# Patient Record
Sex: Female | Born: 1960 | Race: White | Hispanic: No | Marital: Married | Smoking: Never smoker
Health system: Southern US, Community
[De-identification: ages and names within clinical notes are randomized; demographics above are authoritative.]

## PROBLEM LIST (undated history)

## (undated) DIAGNOSIS — R51 Headache: Secondary | ICD-10-CM

## (undated) DIAGNOSIS — K219 Gastro-esophageal reflux disease without esophagitis: Secondary | ICD-10-CM

## (undated) DIAGNOSIS — R519 Headache, unspecified: Secondary | ICD-10-CM

## (undated) DIAGNOSIS — K8020 Calculus of gallbladder without cholecystitis without obstruction: Secondary | ICD-10-CM

## (undated) DIAGNOSIS — R112 Nausea with vomiting, unspecified: Secondary | ICD-10-CM

## (undated) DIAGNOSIS — Z9889 Other specified postprocedural states: Secondary | ICD-10-CM

## (undated) DIAGNOSIS — K819 Cholecystitis, unspecified: Secondary | ICD-10-CM

## (undated) DIAGNOSIS — Z87442 Personal history of urinary calculi: Secondary | ICD-10-CM

## (undated) HISTORY — PX: COLONOSCOPY: SHX174

## (undated) HISTORY — PX: TUBAL LIGATION: SHX77

## (undated) HISTORY — DX: Gastro-esophageal reflux disease without esophagitis: K21.9

## (undated) HISTORY — PX: OTHER SURGICAL HISTORY: SHX169

## (undated) HISTORY — PX: ABDOMINAL HYSTERECTOMY: SHX81

## (undated) HISTORY — PX: TONSILLECTOMY: SUR1361

---

## 1992-08-07 HISTORY — PX: TUBAL LIGATION: SHX77

## 1993-08-07 HISTORY — PX: VAGINAL HYSTERECTOMY: SUR661

## 1993-08-07 HISTORY — PX: ABDOMINAL HYSTERECTOMY: SHX81

## 1997-10-29 ENCOUNTER — Ambulatory Visit (HOSPITAL_COMMUNITY): Admission: RE | Admit: 1997-10-29 | Discharge: 1997-10-29 | Payer: Self-pay | Admitting: Obstetrics & Gynecology

## 1998-11-08 ENCOUNTER — Ambulatory Visit (HOSPITAL_COMMUNITY): Admission: RE | Admit: 1998-11-08 | Discharge: 1998-11-08 | Payer: Self-pay | Admitting: Obstetrics & Gynecology

## 1998-11-08 ENCOUNTER — Encounter: Payer: Self-pay | Admitting: Obstetrics & Gynecology

## 1998-12-01 ENCOUNTER — Other Ambulatory Visit: Admission: RE | Admit: 1998-12-01 | Discharge: 1998-12-01 | Payer: Self-pay | Admitting: Obstetrics & Gynecology

## 1999-12-12 ENCOUNTER — Ambulatory Visit (HOSPITAL_COMMUNITY): Admission: RE | Admit: 1999-12-12 | Discharge: 1999-12-12 | Payer: Self-pay | Admitting: Obstetrics & Gynecology

## 1999-12-12 ENCOUNTER — Encounter: Payer: Self-pay | Admitting: Obstetrics & Gynecology

## 1999-12-29 ENCOUNTER — Other Ambulatory Visit: Admission: RE | Admit: 1999-12-29 | Discharge: 1999-12-29 | Payer: Self-pay | Admitting: Obstetrics & Gynecology

## 2001-02-15 ENCOUNTER — Other Ambulatory Visit: Admission: RE | Admit: 2001-02-15 | Discharge: 2001-02-15 | Payer: Self-pay | Admitting: Obstetrics & Gynecology

## 2002-03-19 ENCOUNTER — Other Ambulatory Visit: Admission: RE | Admit: 2002-03-19 | Discharge: 2002-03-19 | Payer: Self-pay | Admitting: Obstetrics & Gynecology

## 2003-03-27 ENCOUNTER — Other Ambulatory Visit: Admission: RE | Admit: 2003-03-27 | Discharge: 2003-03-27 | Payer: Self-pay | Admitting: Obstetrics & Gynecology

## 2004-04-27 ENCOUNTER — Other Ambulatory Visit: Admission: RE | Admit: 2004-04-27 | Discharge: 2004-04-27 | Payer: Self-pay | Admitting: Obstetrics & Gynecology

## 2005-06-05 ENCOUNTER — Other Ambulatory Visit: Admission: RE | Admit: 2005-06-05 | Discharge: 2005-06-05 | Payer: Self-pay | Admitting: Obstetrics & Gynecology

## 2014-12-06 HISTORY — PX: BREAST BIOPSY: SHX20

## 2014-12-21 ENCOUNTER — Other Ambulatory Visit: Payer: Self-pay | Admitting: Obstetrics & Gynecology

## 2014-12-21 DIAGNOSIS — R928 Other abnormal and inconclusive findings on diagnostic imaging of breast: Secondary | ICD-10-CM

## 2014-12-25 ENCOUNTER — Other Ambulatory Visit: Payer: Self-pay | Admitting: Obstetrics & Gynecology

## 2014-12-25 ENCOUNTER — Ambulatory Visit
Admission: RE | Admit: 2014-12-25 | Discharge: 2014-12-25 | Disposition: A | Discharge status: Home / Self Care | Payer: BC Managed Care – PPO | Source: Ambulatory Visit | Attending: Obstetrics & Gynecology | Admitting: Obstetrics & Gynecology

## 2014-12-25 DIAGNOSIS — R928 Other abnormal and inconclusive findings on diagnostic imaging of breast: Secondary | ICD-10-CM

## 2014-12-29 ENCOUNTER — Ambulatory Visit
Admission: RE | Admit: 2014-12-29 | Discharge: 2014-12-29 | Disposition: A | Discharge status: Home / Self Care | Payer: BC Managed Care – PPO | Source: Ambulatory Visit | Attending: Obstetrics & Gynecology | Admitting: Obstetrics & Gynecology

## 2014-12-29 DIAGNOSIS — R928 Other abnormal and inconclusive findings on diagnostic imaging of breast: Secondary | ICD-10-CM

## 2014-12-31 ENCOUNTER — Other Ambulatory Visit: Payer: BC Managed Care – PPO

## 2016-02-16 ENCOUNTER — Other Ambulatory Visit: Payer: Self-pay | Admitting: Obstetrics & Gynecology

## 2016-02-16 DIAGNOSIS — Z1231 Encounter for screening mammogram for malignant neoplasm of breast: Secondary | ICD-10-CM

## 2016-02-24 ENCOUNTER — Ambulatory Visit
Admission: RE | Admit: 2016-02-24 | Discharge: 2016-02-24 | Disposition: A | Discharge status: Home / Self Care | Payer: BC Managed Care – PPO | Source: Ambulatory Visit | Attending: Obstetrics & Gynecology | Admitting: Obstetrics & Gynecology

## 2016-02-24 DIAGNOSIS — Z1231 Encounter for screening mammogram for malignant neoplasm of breast: Secondary | ICD-10-CM

## 2016-03-27 ENCOUNTER — Encounter: Payer: Self-pay | Admitting: Podiatry

## 2016-03-27 ENCOUNTER — Ambulatory Visit (INDEPENDENT_AMBULATORY_CARE_PROVIDER_SITE_OTHER): Payer: BC Managed Care – PPO | Admitting: Podiatry

## 2016-03-27 ENCOUNTER — Ambulatory Visit (INDEPENDENT_AMBULATORY_CARE_PROVIDER_SITE_OTHER): Payer: BC Managed Care – PPO

## 2016-03-27 VITALS — BP 134/84 | HR 91 | Resp 16 | Ht 62.0 in | Wt 190.0 lb

## 2016-03-27 DIAGNOSIS — M79672 Pain in left foot: Secondary | ICD-10-CM

## 2016-03-27 DIAGNOSIS — M778 Other enthesopathies, not elsewhere classified: Secondary | ICD-10-CM

## 2016-03-27 DIAGNOSIS — M7742 Metatarsalgia, left foot: Secondary | ICD-10-CM | POA: Diagnosis not present

## 2016-03-27 DIAGNOSIS — M79671 Pain in right foot: Secondary | ICD-10-CM | POA: Diagnosis not present

## 2016-03-27 DIAGNOSIS — M779 Enthesopathy, unspecified: Secondary | ICD-10-CM

## 2016-03-27 DIAGNOSIS — M7752 Other enthesopathy of left foot: Secondary | ICD-10-CM

## 2016-03-27 NOTE — Progress Notes (Signed)
Subjective:     Patient ID: Charlene Dunn, female   DOB: 05-Jul-1961, 55 y.o.   MRN: [id number]  HPI patient states that she's been getting pain in the forefoot of her left foot that's been localized in nature but she does not remember specific injury. States that it sometimes makes it hard for her to walk and she does not feel enough support   Review of Systems  All other systems reviewed and are negative.      Objective:   Physical Exam  Constitutional: She is oriented to person, place, and time.  Cardiovascular: Intact distal pulses.   Musculoskeletal: Normal range of motion.  Neurological: She is oriented to person, place, and time.  Skin: Skin is warm.  Nursing note and vitals reviewed.  neurovascular status intact muscle strength adequate range of motion within normal limits with patient found to have inflammation pain forefoot left with fluid buildup around the metatarsophalangeal joints of a mild to moderate nature     Assessment:     Inflammatory capsulitis left    Plan:     H&P x-rays reviewed and dispensed a fascial brace to lift the arch and take forefoot stress off. Advised this may require a softer type orthotic and she'll bring and what she has from the past and we will decide if there is anything new we can do. Reappoint to recheck  X-rays were negative for signs of fracture or indications of metatarsal parabola issues

## 2016-03-27 NOTE — Progress Notes (Signed)
   Subjective:    Patient ID: Charlene Dunn, female    DOB: April 07, 1961, 55 y.o.   MRN: [id number]  HPI Chief Complaint  Patient presents with  . Foot Pain    Bilateral; Left foot-dorsal & below great toe; pt stated, "Had a stress fracture in Left foot, dorsal in 2009, got numbness after that"      Review of Systems  All other systems reviewed and are negative.      Objective:   Physical Exam        Assessment & Plan:

## 2016-04-24 ENCOUNTER — Ambulatory Visit (INDEPENDENT_AMBULATORY_CARE_PROVIDER_SITE_OTHER): Payer: BC Managed Care – PPO | Admitting: Podiatry

## 2016-04-24 DIAGNOSIS — M5432 Sciatica, left side: Secondary | ICD-10-CM

## 2016-04-24 DIAGNOSIS — M5416 Radiculopathy, lumbar region: Secondary | ICD-10-CM | POA: Diagnosis not present

## 2016-04-24 MED ORDER — PRAMIPEXOLE DIHYDROCHLORIDE 0.125 MG PO TABS: 0.1250 mg | ORAL_TABLET | Freq: Three times a day (TID) | ORAL | 0 refills | Status: DC

## 2016-04-24 NOTE — Progress Notes (Signed)
Subjective: Patient presents today with pain and numbness on the dorsum of the left foot which extends up the leg. Patient states that she does experience pain extending proximally throughout the calf and thigh and proximal region of the left lower extremity. Pain is worse especially at night   Objective: Physical Exam General: The patient is alert and oriented x3 in no acute distress.  Dermatology: Skin is warm, dry and supple bilateral lower extremities. Negative for open lesions or macerations.  Vascular: Palpable pedal pulses bilaterally. No edema or erythema noted. Capillary refill within normal limits.  Neurological: Neuritis noted diffusely throughout the left lower extremity. Epicritic and protective threshold grossly intact bilaterally.   Musculoskeletal Exam: Range of motion within normal limits to all pedal and ankle joints bilateral. Muscle strength 5/5 in all groups bilateral.   Assessment: #1 lumbar radiculopathy #2 sciatica left lower extremity #3 paresthesia left foot dorsal #5 cramping left lower extremity. Restless leg syndrome left lower extremity Problem List Items Addressed This Visit    None    Visit Diagnoses   None.       Plan of Care:  #1 Patient was evaluated. #2 Prescription for Mirapex 0.125 mg was dispensed the patient. Take as directed. #3 referral to neuro spine specialist Dr. Danielle DessElsner was given to the patient. #4 patient is to return to clinic on a when necessary basis. At this point there is no further treatment modalities regarding a podiatric foot and ankle specialist. Symptomology and pathology extends proximal possibly to the level of the spine     Dr. Felecia ShellingBrent M. Chevy Sweigert, DPM [facility]

## 2016-04-26 ENCOUNTER — Telehealth: Payer: Self-pay | Admitting: *Deleted

## 2016-04-26 DIAGNOSIS — M5416 Radiculopathy, lumbar region: Secondary | ICD-10-CM

## 2016-04-26 DIAGNOSIS — M5432 Sciatica, left side: Secondary | ICD-10-CM

## 2016-04-26 NOTE — Telephone Encounter (Signed)
Dr. Logan BoresEvans ordered referral to Dr. Danielle DessElsner for lumbar radiculopathy, left extremity sciatica. Faxed referral, pt demographics and clinicals to [facility].

## 2016-05-02 ENCOUNTER — Telehealth: Payer: Self-pay | Admitting: *Deleted

## 2016-05-02 NOTE — Telephone Encounter (Signed)
Pt called for the status of her referral to Dr. Danielle DessElsner. I spoke with pt and she states the new pt coordinator for Dr. Danielle DessElsner just called her.

## 2016-08-30 ENCOUNTER — Other Ambulatory Visit: Payer: Self-pay | Admitting: Family Medicine

## 2016-08-30 DIAGNOSIS — R1011 Right upper quadrant pain: Secondary | ICD-10-CM

## 2016-08-31 ENCOUNTER — Ambulatory Visit
Admission: RE | Admit: 2016-08-31 | Discharge: 2016-08-31 | Disposition: A | Discharge status: Home / Self Care | Payer: BC Managed Care – PPO | Source: Ambulatory Visit | Attending: Family Medicine | Admitting: Family Medicine

## 2016-08-31 DIAGNOSIS — R1011 Right upper quadrant pain: Secondary | ICD-10-CM

## 2016-09-14 ENCOUNTER — Ambulatory Visit: Payer: Self-pay | Admitting: Surgery

## 2016-09-14 NOTE — H&P (Signed)
Charlene Dunn 09/14/2016 9:41 AM Location: [facility] Patient #: [id number] DOB: 1961-02-11 Married / Language: English / Race: White Female  History of Present Illness (Charlene Gwynne A. Fredricka Bonineonnor MD; 09/14/2016 9:59 AM) Patient words: very nice 56 year old woman who is referred for gallstones. She has been having intermittent attacks of right upper quadrant pain for about 10 years. Initially she had an attack once every 2 or 3 years. This past year starting in the summer, the attacks have increased in frequency to every few months, in addition to assist severity of the pain and the duration has worsened. The pain is epigastric and right upper quadrant radiating around to the right flank and back. The most recent couple of attacks were associated with severe nausea and she did have some emesis with the most recent one. She's not had any fevers and has not been treated with any antibiotics, but during her last attack she did take some tramadol that she had at home. This was in mid-late January. Since then she has been sticking to a low-fat diet due to fear of recurrent symptoms. She is undergone an ultrasound which demonstrated multiple gallstones, common bile duct diameter 7.2 mm. Otherwise normal liver parenchyma and no biliary distention. She had lab work done by her primary care doctor, her creatinine is 0.69, lites were normal, LFTs were normal, specifically bilirubin 0.4, alkaline phosphatase 87, AST and ALT 17.  She is here today with her husband, Charlene Dunn. 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 She has never been a smoker.  The patient is a 56 year old female.   Past Surgical History [name](Armen Glenn, CMA; 09/14/2016 9:41 AM) Breast Biopsy Right. Hysterectomy (not due to cancer) - Partial Tonsillectomy  Diagnostic Studies History [name](Armen Glenn, CMA; 09/14/2016 9:41 AM) Colonoscopy 1-5 years ago Mammogram within last year Pap Smear 1-5 years ago  Allergies [name](Armen Glenn, CMA;  09/14/2016 9:48 AM) CODEINE Nausea, Vomiting. Sulfa Antibiotics Nausea, Vomiting.  Medication History [name](Armen Glenn, CMA; 09/14/2016 9:54 AM) Omeprazole (40MG  Capsule DR, Oral) Active. Calcium Carbonate (600MG  Tablet, Oral) Active. Estradiol (2MG  Tablet, Oral) Active. Multivitamins/Minerals (Oral) Active. Medications Reconciled  Social History [name](Armen Glenn, CMA; 09/14/2016 9:41 AM) Alcohol use Occasional alcohol use. Caffeine use Carbonated beverages, Tea. No drug use Tobacco use Never smoker.  Family History [name](Armen Glenn, CMA; 09/14/2016 9:41 AM) Arthritis Mother. Colon Cancer Father. Colon Polyps Father. Heart Disease Father. Hypertension Brother. Melanoma Mother.  Pregnancy / Birth History [name](Armen Glenn, CMA; 09/14/2016 9:41 AM) Age at menarche 14 years. Age of menopause [age] Contraceptive History Oral contraceptives. Gravida 2 Maternal age 54-30 Para 2  Other Problems [name](Armen Glenn, CMA; 09/14/2016 9:41 AM) Cholelithiasis Gastroesophageal Reflux Disease Kidney Stone     Review of Systems [name](Armen Glenn CMA; 09/14/2016 9:41 AM) General Present- Fatigue. Not Present- Appetite Loss, Chills, Fever, Night Sweats, Weight Gain and Weight Loss. Skin Not Present- Change in Wart/Mole, Dryness, Hives, Jaundice, New Lesions, Non-Healing Wounds, Rash and Ulcer. HEENT Present- Seasonal Allergies and Wears glasses/contact lenses. Not Present- Earache, Hearing Loss, Hoarseness, Nose Bleed, Oral Ulcers, Ringing in the Ears, Sinus Pain, Sore Throat, Visual Disturbances and Yellow Eyes. Respiratory Present- Snoring. Not Present- Bloody sputum, Chronic Cough, Difficulty Breathing and Wheezing. Breast Not Present- Breast Mass, Breast Pain, Nipple Discharge and Skin Changes. Cardiovascular Not Present- Chest Pain, Difficulty Breathing Lying Down, Leg Cramps, Palpitations, Rapid Heart Rate, Shortness of Breath and Swelling of Extremities. Gastrointestinal Present- Abdominal Pain  and Bloating. Not Present- Bloody Stool, Change in Bowel  Habits, Chronic diarrhea, Constipation, Difficulty Swallowing, Excessive gas, Gets full quickly at meals, Hemorrhoids, Indigestion, Nausea, Rectal Pain and Vomiting. Female Genitourinary Present- Nocturia and Urgency. Not Present- Frequency, Painful Urination and Pelvic Pain. Musculoskeletal Present- Joint Pain and Joint Stiffness. Not Present- Back Pain, Muscle Pain, Muscle Weakness and Swelling of Extremities. Neurological Present- Headaches. Not Present- Decreased Memory, Fainting, Numbness, Seizures, Tingling, Tremor, Trouble walking and Weakness. Psychiatric Not Present- Anxiety, Bipolar, Change in Sleep Pattern, Depression, Fearful and Frequent crying. Endocrine Present- Hot flashes. Not Present- Cold Intolerance, Excessive Hunger, Hair Changes, Heat Intolerance and New Diabetes. Hematology Not Present- Blood Thinners, Easy Bruising, Excessive bleeding, Gland problems, HIV and Persistent Infections.  Vitals (Armen Glenn CMA; 09/14/2016 9:42 AM) 09/14/2016 9:42 AM Weight: 203.5 lb Height: 61in Body Surface Area: 1.9 m Body Mass Index: 38.45 kg/m  Temp.: 98.67F  Pulse: 108 (Regular)  P.OX: 98% (Room air) BP: 150/90 (Sitting, Left Arm, Standard)      Physical Exam (Avyn Coate A. Fredricka Bonine MD; 09/14/2016 10:01 AM)  General Note: Alert and oriented, no distress  Integumentary Note: No lesions or rashes on limited skin exam  Head and Neck Note: No mass or thyromegaly  Eye Note: Anicteric, extraocular motion intact  ENMT Note: moist mucus membranes, good dentition  Chest and Lung Exam Note: unlabored respirations, symmetrical air entry  Cardiovascular Note: regular rate and rhythm, palpable distal pulses  Abdomen Note: obese, minimally tender in RUQ subcostal margin. No hernia or mass.  Neurologic Note: grossly normal, normal gait  Neuropsychiatric Note: normal mood and affect, appropriate  insight  Musculoskeletal Note: strength symmetrical throughout, no deformity    Assessment & Plan (Esaias Cleavenger A. Preslynn Bier MD; 09/14/2016 10:03 AM)  SYMPTOMATIC CHOLELITHIASIS (K80.20) Story: She has classic symptoms, and increasing frequency and severity of attacks. May additionally have some component of chronic cholecystitis. I discussed with her laparoscopic cholecystectomy, we discussed the risks of conversion to open surgery, bleeding, infection, pain, scarring, bile leak, injury to adjacent structures specifically the common bile duct and sequelae. She expressed understanding and asked appropriate questions. We will get surgery scheduled as soon as possible given her crescendoing symptoms.

## 2016-09-18 NOTE — Patient Instructions (Addendum)
Charlene Dunn  09/18/2016   Your procedure is scheduled on:  09-25-16  Report [facility]  [facility] to 3rd floor to  Short Stay Center at 730AM.  Call this number if you have problems the morning of surgery [phone]   Remember: ONLY 1 PERSON MAY GO WITH YOU TO SHORT STAY TO GET  READY MORNING OF YOUR SURGERY.  Do not eat food or drink liquids :After Midnight.     Take these medicines the morning of surgery with A SIP OF WATER: tylenol as needed, omeprazole(prilosec)                                You may not have any metal on your body including hair pins and              piercings  Do not wear jewelry, make-up, lotions, powders or perfumes, deodorant             Do not wear nail polish.  Do not shave  48 hours prior to surgery.              Men may shave face and neck.   Do not bring valuables to the hospital. [facility] IS NOT             RESPONSIBLE   FOR VALUABLES.  Contacts, dentures or bridgework may not be worn into surgery.  Leave suitcase in the car. After surgery it may be brought to your room.     Patients discharged the day of surgery will not be allowed to drive home.  Name and phone number of your driver:  Special Instructions: N/A              Please read over the following fact sheets you were given: _____________________________________________________________________             [facility] - Preparing for [facility] Before surgery, you can play an important role.  Because skin is not sterile, your skin needs to be as free of germs as possible.  You can reduce the number of germs on your skin by washing with CHG (chlorahexidine gluconate) soap before surgery.  CHG is an antiseptic cleaner which kills germs and bonds with the skin to continue killing germs even after washing. Please DO NOT use if you have an allergy to CHG or antibacterial soaps.  If your skin becomes reddened/irritated stop using the CHG and  inform your nurse when you arrive at Short Stay. Do not shave (including legs and underarms) for at least 48 hours prior to the first CHG shower.  You may shave your face/neck. Please follow these instructions carefully:  1.  Shower with CHG Soap the night before surgery and the  morning of Surgery.  2.  If you choose to wash your hair, wash your hair first as usual with your  normal  shampoo.  3.  After you shampoo, rinse your hair and body thoroughly to remove the  shampoo.                           4.  Use CHG as you would any other liquid soap.  You can apply chg directly  to the skin and wash  Gently with a scrungie or clean washcloth.  5.  Apply the CHG Soap to your body ONLY FROM THE NECK DOWN.   Do not use on face/ open                           Wound or open sores. Avoid contact with eyes, ears mouth and genitals (private parts).                       Wash face,  Genitals (private parts) with your normal soap.             6.  Wash thoroughly, paying special attention to the area where your surgery  will be performed.  7.  Thoroughly rinse your body with warm water from the neck down.  8.  DO NOT shower/wash with your normal soap after using and rinsing off  the CHG Soap.                9.  Pat yourself dry with a clean towel.            10.  Wear clean pajamas.            11.  Place clean sheets on your bed the night of your first shower and do not  sleep with pets. Day of Surgery : Do not apply any lotions/deodorants the morning of surgery.  Please wear clean clothes to the hospital/surgery center.  FAILURE TO FOLLOW THESE INSTRUCTIONS MAY RESULT IN THE CANCELLATION OF YOUR SURGERY PATIENT SIGNATURE_________________________________  NURSE SIGNATURE__________________________________  ________________________________________________________________________

## 2016-09-19 ENCOUNTER — Encounter (HOSPITAL_COMMUNITY): Payer: Self-pay | Admitting: Emergency Medicine

## 2016-09-19 ENCOUNTER — Encounter (HOSPITAL_COMMUNITY)
Admission: RE | Admit: 2016-09-19 | Discharge: 2016-09-19 | Disposition: A | Discharge status: Home / Self Care | Payer: BC Managed Care – PPO | Source: Ambulatory Visit | Attending: Surgery | Admitting: Surgery

## 2016-09-19 DIAGNOSIS — Z01812 Encounter for preprocedural laboratory examination: Secondary | ICD-10-CM | POA: Insufficient documentation

## 2016-09-19 DIAGNOSIS — R9431 Abnormal electrocardiogram [ECG] [EKG]: Secondary | ICD-10-CM | POA: Diagnosis not present

## 2016-09-19 DIAGNOSIS — Z01810 Encounter for preprocedural cardiovascular examination: Secondary | ICD-10-CM | POA: Insufficient documentation

## 2016-09-19 HISTORY — DX: Nausea with vomiting, unspecified: R11.2

## 2016-09-19 HISTORY — DX: Other specified postprocedural states: Z98.890

## 2016-09-19 HISTORY — DX: Headache: R51

## 2016-09-19 HISTORY — DX: Headache, unspecified: R51.9

## 2016-09-19 HISTORY — DX: Calculus of gallbladder without cholecystitis without obstruction: K80.20

## 2016-09-19 HISTORY — DX: Cholecystitis, unspecified: K81.9

## 2016-09-19 LAB — CBC
HCT: 38.4 % (ref 36.0–46.0)
HEMOGLOBIN: 12.7 g/dL (ref 12.0–15.0)
MCH: 29.3 pg (ref 26.0–34.0)
MCHC: 33.1 g/dL (ref 30.0–36.0)
MCV: 88.5 fL (ref 78.0–100.0)
Platelets: 361 10*3/uL (ref 150–400)
RBC: 4.34 MIL/uL (ref 3.87–5.11)
RDW: 14 % (ref 11.5–15.5)
WBC: 7.6 10*3/uL (ref 4.0–10.5)

## 2016-09-25 ENCOUNTER — Encounter (HOSPITAL_COMMUNITY): Payer: Self-pay | Admitting: Anesthesiology

## 2016-09-25 ENCOUNTER — Ambulatory Visit (HOSPITAL_COMMUNITY)
Admission: RE | Admit: 2016-09-25 | Discharge: 2016-09-25 | Disposition: A | Discharge status: Home / Self Care | Payer: BC Managed Care – PPO | Source: Ambulatory Visit | Attending: Surgery | Admitting: Surgery

## 2016-09-25 ENCOUNTER — Ambulatory Visit (HOSPITAL_COMMUNITY): Payer: BC Managed Care – PPO | Admitting: Anesthesiology

## 2016-09-25 ENCOUNTER — Encounter (HOSPITAL_COMMUNITY)
Admission: RE | Disposition: A | Discharge status: Home / Self Care | Payer: Self-pay | Source: Ambulatory Visit | Attending: Surgery

## 2016-09-25 DIAGNOSIS — Z882 Allergy status to sulfonamides status: Secondary | ICD-10-CM | POA: Diagnosis not present

## 2016-09-25 DIAGNOSIS — K219 Gastro-esophageal reflux disease without esophagitis: Secondary | ICD-10-CM | POA: Diagnosis not present

## 2016-09-25 DIAGNOSIS — E669 Obesity, unspecified: Secondary | ICD-10-CM | POA: Insufficient documentation

## 2016-09-25 DIAGNOSIS — Z885 Allergy status to narcotic agent status: Secondary | ICD-10-CM | POA: Insufficient documentation

## 2016-09-25 DIAGNOSIS — Z6837 Body mass index (BMI) 37.0-37.9, adult: Secondary | ICD-10-CM | POA: Insufficient documentation

## 2016-09-25 DIAGNOSIS — K8010 Calculus of gallbladder with chronic cholecystitis without obstruction: Secondary | ICD-10-CM | POA: Diagnosis not present

## 2016-09-25 DIAGNOSIS — K8050 Calculus of bile duct without cholangitis or cholecystitis without obstruction: Secondary | ICD-10-CM | POA: Diagnosis present

## 2016-09-25 HISTORY — PX: CHOLECYSTECTOMY: SHX55

## 2016-09-25 SURGERY — LAPAROSCOPIC CHOLECYSTECTOMY
Anesthesia: General | Site: Abdomen

## 2016-09-25 MED ORDER — FENTANYL CITRATE (PF) 100 MCG/2ML IJ SOLN
25.0000 ug | INTRAMUSCULAR | Status: DC | PRN
  Administered 2016-09-25: 50 ug via INTRAVENOUS

## 2016-09-25 MED ORDER — SUFENTANIL CITRATE 50 MCG/ML IV SOLN
INTRAVENOUS | Status: DC | PRN
  Administered 2016-09-25 (×3): 5 ug via INTRAVENOUS

## 2016-09-25 MED ORDER — ONDANSETRON HCL 4 MG/2ML IJ SOLN
INTRAMUSCULAR | Status: AC
  Filled 2016-09-25: qty 2

## 2016-09-25 MED ORDER — SUFENTANIL CITRATE 50 MCG/ML IV SOLN
INTRAVENOUS | Status: AC
  Filled 2016-09-25: qty 1

## 2016-09-25 MED ORDER — LACTATED RINGERS IR SOLN
Status: DC | PRN
  Administered 2016-09-25: 1000 mL

## 2016-09-25 MED ORDER — ROCURONIUM BROMIDE 50 MG/5ML IV SOSY
PREFILLED_SYRINGE | INTRAVENOUS | Status: AC
  Filled 2016-09-25: qty 5

## 2016-09-25 MED ORDER — SODIUM CHLORIDE 0.9% FLUSH: 3.0000 mL | Freq: Two times a day (BID) | INTRAVENOUS | Status: DC

## 2016-09-25 MED ORDER — CELECOXIB 200 MG PO CAPS
ORAL_CAPSULE | ORAL | Status: AC
  Filled 2016-09-25: qty 1

## 2016-09-25 MED ORDER — HYDROMORPHONE HCL 1 MG/ML IJ SOLN: 0.2500 mg | INTRAMUSCULAR | Status: DC | PRN

## 2016-09-25 MED ORDER — PROPOFOL 10 MG/ML IV BOLUS
INTRAVENOUS | Status: DC | PRN
  Administered 2016-09-25: 200 mg via INTRAVENOUS

## 2016-09-25 MED ORDER — LIDOCAINE 2% (20 MG/ML) 5 ML SYRINGE
INTRAMUSCULAR | Status: AC
  Filled 2016-09-25: qty 5

## 2016-09-25 MED ORDER — LIDOCAINE HCL (CARDIAC) 20 MG/ML IV SOLN
INTRAVENOUS | Status: DC | PRN
  Administered 2016-09-25: 100 mg via INTRAVENOUS

## 2016-09-25 MED ORDER — GABAPENTIN 300 MG PO CAPS
300.0000 mg | ORAL_CAPSULE | ORAL | Status: AC
  Administered 2016-09-25: 300 mg via ORAL

## 2016-09-25 MED ORDER — SODIUM CHLORIDE 0.9 % IJ SOLN
INTRAMUSCULAR | Status: AC
  Filled 2016-09-25: qty 10

## 2016-09-25 MED ORDER — CELECOXIB 200 MG PO CAPS
400.0000 mg | ORAL_CAPSULE | ORAL | Status: AC
  Administered 2016-09-25: 400 mg via ORAL

## 2016-09-25 MED ORDER — BUPIVACAINE HCL (PF) 0.25 % IJ SOLN
INTRAMUSCULAR | Status: AC
  Filled 2016-09-25: qty 30

## 2016-09-25 MED ORDER — SODIUM CHLORIDE 0.9% FLUSH: 3.0000 mL | INTRAVENOUS | Status: DC | PRN

## 2016-09-25 MED ORDER — KETOROLAC TROMETHAMINE 30 MG/ML IJ SOLN
INTRAMUSCULAR | Status: AC
  Administered 2016-09-25: 30 mg via INTRAVENOUS
  Filled 2016-09-25: qty 1

## 2016-09-25 MED ORDER — BUPIVACAINE HCL 0.25 % IJ SOLN
INTRAMUSCULAR | Status: DC | PRN
  Administered 2016-09-25: 30 mL

## 2016-09-25 MED ORDER — DEXAMETHASONE SODIUM PHOSPHATE 10 MG/ML IJ SOLN
INTRAMUSCULAR | Status: AC
  Filled 2016-09-25: qty 1

## 2016-09-25 MED ORDER — DOCUSATE SODIUM 100 MG PO CAPS: 100.0000 mg | ORAL_CAPSULE | Freq: Two times a day (BID) | ORAL | 0 refills | Status: AC

## 2016-09-25 MED ORDER — MIDAZOLAM HCL 5 MG/5ML IJ SOLN
INTRAMUSCULAR | Status: DC | PRN
  Administered 2016-09-25: 2 mg via INTRAVENOUS

## 2016-09-25 MED ORDER — GABAPENTIN 300 MG PO CAPS
ORAL_CAPSULE | ORAL | Status: AC
  Filled 2016-09-25: qty 1

## 2016-09-25 MED ORDER — SCOPOLAMINE 1 MG/3DAYS TD PT72
1.0000 | MEDICATED_PATCH | Freq: Once | TRANSDERMAL | Status: DC
  Administered 2016-09-25: 1.5 mg via TRANSDERMAL

## 2016-09-25 MED ORDER — PROMETHAZINE HCL 25 MG/ML IJ SOLN
INTRAMUSCULAR | Status: AC
  Administered 2016-09-25: 6.25 mg via INTRAVENOUS
  Filled 2016-09-25: qty 1

## 2016-09-25 MED ORDER — MIDAZOLAM HCL 2 MG/2ML IJ SOLN
INTRAMUSCULAR | Status: AC
  Filled 2016-09-25: qty 2

## 2016-09-25 MED ORDER — MEPERIDINE HCL 50 MG/ML IJ SOLN
6.2500 mg | INTRAMUSCULAR | Status: DC | PRN
Start: 2016-09-25 — End: 2016-09-25

## 2016-09-25 MED ORDER — SUGAMMADEX SODIUM 200 MG/2ML IV SOLN
INTRAVENOUS | Status: DC | PRN
  Administered 2016-09-25: 200 mg via INTRAVENOUS

## 2016-09-25 MED ORDER — ACETAMINOPHEN 500 MG PO TABS
ORAL_TABLET | ORAL | Status: AC
  Filled 2016-09-25: qty 2

## 2016-09-25 MED ORDER — DEXAMETHASONE SODIUM PHOSPHATE 10 MG/ML IJ SOLN
INTRAMUSCULAR | Status: DC | PRN
  Administered 2016-09-25: 10 mg via INTRAVENOUS

## 2016-09-25 MED ORDER — OXYCODONE HCL 5 MG PO TABS: 5.0000 mg | ORAL_TABLET | ORAL | Status: DC | PRN

## 2016-09-25 MED ORDER — LACTATED RINGERS IV SOLN
INTRAVENOUS | Status: DC | PRN
  Administered 2016-09-25 (×2): via INTRAVENOUS

## 2016-09-25 MED ORDER — CEFAZOLIN SODIUM-DEXTROSE 2-3 GM-% IV SOLR
INTRAVENOUS | Status: DC | PRN
  Administered 2016-09-25: 2 g via INTRAVENOUS

## 2016-09-25 MED ORDER — ONDANSETRON HCL 4 MG/2ML IJ SOLN
INTRAMUSCULAR | Status: DC | PRN
  Administered 2016-09-25: 4 mg via INTRAVENOUS

## 2016-09-25 MED ORDER — ACETAMINOPHEN 650 MG RE SUPP
650.0000 mg | RECTAL | Status: DC | PRN
  Filled 2016-09-25: qty 1

## 2016-09-25 MED ORDER — SCOPOLAMINE 1 MG/3DAYS TD PT72
MEDICATED_PATCH | TRANSDERMAL | Status: AC
  Filled 2016-09-25: qty 1

## 2016-09-25 MED ORDER — ROCURONIUM BROMIDE 100 MG/10ML IV SOLN
INTRAVENOUS | Status: DC | PRN
  Administered 2016-09-25: 40 mg via INTRAVENOUS
  Administered 2016-09-25: 10 mg via INTRAVENOUS

## 2016-09-25 MED ORDER — HYDROCODONE-ACETAMINOPHEN 5-325 MG PO TABS
1.0000 | ORAL_TABLET | Freq: Once | ORAL | Status: AC
  Administered 2016-09-25: 1 via ORAL
  Filled 2016-09-25: qty 1

## 2016-09-25 MED ORDER — CEFAZOLIN SODIUM-DEXTROSE 2-4 GM/100ML-% IV SOLN
INTRAVENOUS | Status: AC
  Filled 2016-09-25: qty 100

## 2016-09-25 MED ORDER — CEFAZOLIN SODIUM-DEXTROSE 2-4 GM/100ML-% IV SOLN: 2.0000 g | INTRAVENOUS | Status: DC

## 2016-09-25 MED ORDER — ACETAMINOPHEN 325 MG PO TABS: 650.0000 mg | ORAL_TABLET | ORAL | Status: DC | PRN

## 2016-09-25 MED ORDER — PROMETHAZINE HCL 25 MG/ML IJ SOLN
6.2500 mg | INTRAMUSCULAR | Status: DC | PRN
  Administered 2016-09-25: 6.25 mg via INTRAVENOUS

## 2016-09-25 MED ORDER — IOPAMIDOL (ISOVUE-300) INJECTION 61%
INTRAVENOUS | Status: AC
  Filled 2016-09-25: qty 50

## 2016-09-25 MED ORDER — PROPOFOL 10 MG/ML IV BOLUS
INTRAVENOUS | Status: AC
  Filled 2016-09-25: qty 20

## 2016-09-25 MED ORDER — SODIUM CHLORIDE 0.9 % IV SOLN: 250.0000 mL | INTRAVENOUS | Status: DC | PRN

## 2016-09-25 MED ORDER — ACETAMINOPHEN 500 MG PO TABS
1000.0000 mg | ORAL_TABLET | ORAL | Status: AC
  Administered 2016-09-25: 1000 mg via ORAL

## 2016-09-25 MED ORDER — FENTANYL CITRATE (PF) 100 MCG/2ML IJ SOLN
INTRAMUSCULAR | Status: AC
  Filled 2016-09-25: qty 2

## 2016-09-25 MED ORDER — KETOROLAC TROMETHAMINE 30 MG/ML IJ SOLN
30.0000 mg | Freq: Once | INTRAMUSCULAR | Status: AC
  Administered 2016-09-25: 30 mg via INTRAVENOUS

## 2016-09-25 MED ORDER — HYDROCODONE-ACETAMINOPHEN 5-325 MG PO TABS: 1.0000 | ORAL_TABLET | Freq: Four times a day (QID) | ORAL | 0 refills | Status: DC | PRN

## 2016-09-25 SURGICAL SUPPLY — 38 items
ADH SKN CLS APL DERMABOND .7 (GAUZE/BANDAGES/DRESSINGS) ×1
APPLIER CLIP ROT 10 11.4 M/L (STAPLE) ×3
APR CLP MED LRG 11.4X10 (STAPLE) ×1
BAG SPEC RTRVL LRG 6X4 10 (ENDOMECHANICALS) ×1
CABLE HIGH FREQUENCY MONO STRZ (ELECTRODE) ×3 IMPLANT
CHLORAPREP W/TINT 26ML (MISCELLANEOUS) ×3 IMPLANT
CLIP APPLIE ROT 10 11.4 M/L (STAPLE) ×1 IMPLANT
COVER MAYO STAND STRL (DRAPES) IMPLANT
COVER SURGICAL LIGHT HANDLE (MISCELLANEOUS) ×1 IMPLANT
DECANTER SPIKE VIAL GLASS SM (MISCELLANEOUS) ×1 IMPLANT
DERMABOND ADVANCED (GAUZE/BANDAGES/DRESSINGS) ×2
DERMABOND ADVANCED .7 DNX12 (GAUZE/BANDAGES/DRESSINGS) ×1 IMPLANT
DEVICE PMI PUNCTURE CLOSURE (MISCELLANEOUS) ×3 IMPLANT
DRAPE C-ARM 42X120 X-RAY (DRAPES) IMPLANT
ELECT REM PT RETURN 9FT ADLT (ELECTROSURGICAL) ×3
ELECTRODE REM PT RTRN 9FT ADLT (ELECTROSURGICAL) ×1 IMPLANT
GLOVE BIO SURGEON STRL SZ 6 (GLOVE) ×3 IMPLANT
GLOVE INDICATOR 6.5 STRL GRN (GLOVE) ×3 IMPLANT
GOWN STRL REUS W/TWL LRG LVL3 (GOWN DISPOSABLE) ×5 IMPLANT
GOWN STRL REUS W/TWL XL LVL3 (GOWN DISPOSABLE) ×4 IMPLANT
HEMOSTAT SNOW SURGICEL 2X4 (HEMOSTASIS) IMPLANT
IRRIG SUCT STRYKERFLOW 2 WTIP (MISCELLANEOUS)
IRRIGATION SUCT STRKRFLW 2 WTP (MISCELLANEOUS) ×1 IMPLANT
KIT BASIN OR (CUSTOM PROCEDURE TRAY) ×3 IMPLANT
NDL INSUFFLATION 14GA 120MM (NEEDLE) ×1 IMPLANT
NEEDLE INSUFFLATION 14GA 120MM (NEEDLE) ×3 IMPLANT
POUCH SPECIMEN RETRIEVAL 10MM (ENDOMECHANICALS) ×3 IMPLANT
SCISSORS LAP 5X35 DISP (ENDOMECHANICALS) ×3 IMPLANT
SET CHOLANGIOGRAPH MIX (MISCELLANEOUS) IMPLANT
SET IRRIG TUBING LAPAROSCOPIC (IRRIGATION / IRRIGATOR) ×2 IMPLANT
SLEEVE XCEL OPT CAN 5 100 (ENDOMECHANICALS) ×6 IMPLANT
SUT MNCRL AB 4-0 PS2 18 (SUTURE) ×3 IMPLANT
TOWEL OR 17X26 10 PK STRL BLUE (TOWEL DISPOSABLE) ×3 IMPLANT
TOWEL OR NON WOVEN STRL DISP B (DISPOSABLE) IMPLANT
TRAY LAPAROSCOPIC (CUSTOM PROCEDURE TRAY) ×3 IMPLANT
TROCAR BLADELESS OPT 5 100 (ENDOMECHANICALS) ×3 IMPLANT
TROCAR XCEL 12X100 BLDLESS (ENDOMECHANICALS) ×3 IMPLANT
TUBING INSUF HEATED (TUBING) ×3 IMPLANT

## 2016-09-25 NOTE — Op Note (Signed)
Operative Note  Charlene Dunn 56 y.o. female [id number]  09/25/2016  Surgeon: Berna Buehelsea A Terena Bohan   Assistant: none  Procedure performed: Laparoscopic Cholecystectomy  Preop diagnosis: biliary colic Post-op diagnosis/intraop findings: chronic cholecystitis  Specimens: gallbladder  EBL: <10cc  Complications: none  Description of procedure: After obtaining informed consent the patient was brought to the operating room. Prophylactic antibiotics and subcutaneous heparin were administered. SCD's were applied. General endotracheal anesthesia was initiated and a formal time-out was performed. The abdomen was prepped and draped in the usual sterile fashion and the abdomen was entered using visiport technique in the left upper quadrant after instilling the site with local. Insufflation to 15mmHg was obtained and gross inspection revealed no evidence of injury from our entry or other intraabdominal abnormalities. Three 5mm trocars were introduced in the supraumbilical, right midclavicular and right anterior axillary lines under direct visualization and following infiltration with local. The entry port was upsized to a 12mm trocar. The gallbladder was retracted cephalad and the infundibulum was retracted laterally. A combination of hook electrocautery and blunt dissection was utilized to clear the peritoneum from the neck and cystic duct, circumferentially isolating the cystic artery and cystic duct and lifting the gallbladder from the cystic plate. The gallbladder was thickened with chronic inflammation and scarring around the cystic duct. The critical view of safety was achieved with the cystic artery, cystic duct, and liver bed visualized between them with no other structures. The artery was clipped with a single clip proximally and distally and divided as was the cystic duct with two clips on the proximal end. The gallbladder was dissected from the liver plate using electrocautery. Once freed the  gallbladder was placed in an endocatch bag and removed through the epigastric trocar site intact. A small amount of bleeding on the liver bed was controlled with cautery. Hemostasis was once again confirmed, and reinspection of the abdomen revealed no injuries. The clips were well opposed without any bile leak from the duct or the liver bed. The 12mm trocar site in the epigastrium was closed with a 0 vicryl in the fascia under direct visualization using a PMI device. The abdomen was desufflated and all trocars removed. The skin incisions were closed with running subcuticular monocryl and dermabond. The patient was awakened, extubated and transported to the recovery room in stable condition.   All counts were correct at the completion of the case.

## 2016-09-25 NOTE — H&P (View-Only) (Signed)
Charlene Dunn 09/14/2016 9:41 AM Location: [facility] Patient #: [id number] DOB: 1960/10/30 Married / Language: English / Race: White Female  History of Present Illness (Kalman Nylen A. Fredricka Bonineonnor MD; 09/14/2016 9:59 AM) Patient words: very nice 56 year old woman who is referred for gallstones. She has been having intermittent attacks of right upper quadrant pain for about 10 years. Initially she had an attack once every 2 or 3 years. This past year starting in the summer, the attacks have increased in frequency to every few months, in addition to assist severity of the pain and the duration has worsened. The pain is epigastric and right upper quadrant radiating around to the right flank and back. The most recent couple of attacks were associated with severe nausea and she did have some emesis with the most recent one. She's not had any fevers and has not been treated with any antibiotics, but during her last attack she did take some tramadol that she had at home. This was in mid-late January. Since then she has been sticking to a low-fat diet due to fear of recurrent symptoms. She is undergone an ultrasound which demonstrated multiple gallstones, common bile duct diameter 7.2 mm. Otherwise normal liver parenchyma and no biliary distention. She had lab work done by her primary care doctor, her creatinine is 0.69, lites were normal, LFTs were normal, specifically bilirubin 0.4, alkaline phosphatase 87, AST and ALT 17.  She is here today with her husband, [name]. 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 She has never been a smoker.  The patient is a 56 year old female.   Past Surgical History [name](Armen Glenn, CMA; 09/14/2016 9:41 AM) Breast Biopsy Right. Hysterectomy (not due to cancer) - Partial Tonsillectomy  Diagnostic Studies History [name](Armen Glenn, CMA; 09/14/2016 9:41 AM) Colonoscopy 1-5 years ago Mammogram within last year Pap Smear 1-5 years ago  Allergies [name](Armen Glenn, CMA;  09/14/2016 9:48 AM) CODEINE Nausea, Vomiting. Sulfa Antibiotics Nausea, Vomiting.  Medication History [name](Armen Glenn, CMA; 09/14/2016 9:54 AM) Omeprazole (40MG  Capsule DR, Oral) Active. Calcium Carbonate (600MG  Tablet, Oral) Active. Estradiol (2MG  Tablet, Oral) Active. Multivitamins/Minerals (Oral) Active. Medications Reconciled  Social History [name](Armen Glenn, CMA; 09/14/2016 9:41 AM) Alcohol use Occasional alcohol use. Caffeine use Carbonated beverages, Tea. No drug use Tobacco use Never smoker.  Family History [name](Armen Glenn, CMA; 09/14/2016 9:41 AM) Arthritis Mother. Colon Cancer Father. Colon Polyps Father. Heart Disease Father. Hypertension Brother. Melanoma Mother.  Pregnancy / Birth History [name](Armen Glenn, CMA; 09/14/2016 9:41 AM) Age at menarche 14 years. Age of menopause 53-55 Contraceptive History Oral contraceptives. Gravida 2 Maternal age 54-30 Para 2  Other Problems [name](Armen Glenn, CMA; 09/14/2016 9:41 AM) Cholelithiasis Gastroesophageal Reflux Disease Kidney Stone     Review of Systems [name](Armen Glenn CMA; 09/14/2016 9:41 AM) General Present- Fatigue. Not Present- Appetite Loss, Chills, Fever, Night Sweats, Weight Gain and Weight Loss. Skin Not Present- Change in Wart/Mole, Dryness, Hives, Jaundice, New Lesions, Non-Healing Wounds, Rash and Ulcer. HEENT Present- Seasonal Allergies and Wears glasses/contact lenses. Not Present- Earache, Hearing Loss, Hoarseness, Nose Bleed, Oral Ulcers, Ringing in the Ears, Sinus Pain, Sore Throat, Visual Disturbances and Yellow Eyes. Respiratory Present- Snoring. Not Present- Bloody sputum, Chronic Cough, Difficulty Breathing and Wheezing. Breast Not Present- Breast Mass, Breast Pain, Nipple Discharge and Skin Changes. Cardiovascular Not Present- Chest Pain, Difficulty Breathing Lying Down, Leg Cramps, Palpitations, Rapid Heart Rate, Shortness of Breath and Swelling of Extremities. Gastrointestinal Present- Abdominal Pain  and Bloating. Not Present- Bloody Stool, Change in Bowel  Habits, Chronic diarrhea, Constipation, Difficulty Swallowing, Excessive gas, Gets full quickly at meals, Hemorrhoids, Indigestion, Nausea, Rectal Pain and Vomiting. Female Genitourinary Present- Nocturia and Urgency. Not Present- Frequency, Painful Urination and Pelvic Pain. Musculoskeletal Present- Joint Pain and Joint Stiffness. Not Present- Back Pain, Muscle Pain, Muscle Weakness and Swelling of Extremities. Neurological Present- Headaches. Not Present- Decreased Memory, Fainting, Numbness, Seizures, Tingling, Tremor, Trouble walking and Weakness. Psychiatric Not Present- Anxiety, Bipolar, Change in Sleep Pattern, Depression, Fearful and Frequent crying. Endocrine Present- Hot flashes. Not Present- Cold Intolerance, Excessive Hunger, Hair Changes, Heat Intolerance and New Diabetes. Hematology Not Present- Blood Thinners, Easy Bruising, Excessive bleeding, Gland problems, HIV and Persistent Infections.  Vitals (Armen Glenn CMA; 09/14/2016 9:42 AM) 09/14/2016 9:42 AM Weight: 203.5 lb Height: 61in Body Surface Area: 1.9 m Body Mass Index: 38.45 kg/m  Temp.: 98.52F  Pulse: 108 (Regular)  P.OX: 98% (Room air) BP: 150/90 (Sitting, Left Arm, Standard)      Physical Exam (Hazen Brumett A. Fredricka Bonine MD; 09/14/2016 10:01 AM)  General Note: Alert and oriented, no distress  Integumentary Note: No lesions or rashes on limited skin exam  Head and Neck Note: No mass or thyromegaly  Eye Note: Anicteric, extraocular motion intact  ENMT Note: moist mucus membranes, good dentition  Chest and Lung Exam Note: unlabored respirations, symmetrical air entry  Cardiovascular Note: regular rate and rhythm, palpable distal pulses  Abdomen Note: obese, minimally tender in RUQ subcostal margin. No hernia or mass.  Neurologic Note: grossly normal, normal gait  Neuropsychiatric Note: normal mood and affect, appropriate  insight  Musculoskeletal Note: strength symmetrical throughout, no deformity    Assessment & Plan (Robson Trickey A. Vernetta Dizdarevic MD; 09/14/2016 10:03 AM)  SYMPTOMATIC CHOLELITHIASIS (K80.20) Story: She has classic symptoms, and increasing frequency and severity of attacks. May additionally have some component of chronic cholecystitis. I discussed with her laparoscopic cholecystectomy, we discussed the risks of conversion to open surgery, bleeding, infection, pain, scarring, bile leak, injury to adjacent structures specifically the common bile duct and sequelae. She expressed understanding and asked appropriate questions. We will get surgery scheduled as soon as possible given her crescendoing symptoms.

## 2016-09-25 NOTE — Discharge Instructions (Signed)
[facility] ______[facility] °LAPAROSCOPIC SURGERY: POST OP INSTRUCTIONS °Always review your discharge instruction sheet given to you by the facility where your surgery was performed. °IF YOU HAVE DISABILITY OR FAMILY LEAVE FORMS, YOU MUST BRING THEM TO THE OFFICE FOR PROCESSING.   °DO NOT GIVE THEM TO YOUR DOCTOR. ° °1. A prescription for pain medication may be given to you upon discharge.  Take your pain medication as prescribed, if needed.  If narcotic pain medicine is not needed, then you may take acetaminophen (Tylenol) or ibuprofen (Advil) as needed. °2. Take your usually prescribed medications unless otherwise directed. °3. If you need a refill on your pain medication, please contact your pharmacy.  They will contact our office to request authorization. Prescriptions will not be filled after 5pm or on week-ends. °4. You should follow a light diet the first few days after arrival home, such as soup and crackers, etc.  Be sure to include lots of fluids daily. °5. Most patients will experience some swelling and bruising in the area of the incisions.  Ice packs will help.  Swelling and bruising can take several days to resolve.  °6. It is common to experience some constipation if taking pain medication after surgery.  Increasing fluid intake and taking a stool softener (such as Colace) will usually help or prevent this problem from occurring.  A mild laxative (Milk of Magnesia or Miralax) should be taken according to package instructions if there are no bowel movements after 48 hours. °7. Unless discharge instructions indicate otherwise, you may remove your bandages 24-48 hours after surgery, and you may shower at that time.  You may have steri-strips (small skin tapes) in place directly over the incision.  These strips should be left on the skin for 7-10 days.  If your surgeon used skin glue on the incision, you may shower in 24 hours.  The glue will flake off over the next 2-3 weeks.  Any sutures or  staples will be removed at the office during your follow-up visit. °8. ACTIVITIES:  You may resume regular (light) daily activities beginning the next day--such as daily self-care, walking, climbing stairs--gradually increasing activities as tolerated.  You may have sexual intercourse when it is comfortable.  Refrain from any heavy lifting or straining until approved by your doctor. °a. You may drive when you are no longer taking prescription pain medication, you can comfortably wear a seatbelt, and you can safely maneuver your car and apply brakes. °b. RETURN TO WORK:  1 week__________________________________________________________ °9. You should see your doctor in the office for a follow-up appointment approximately 2-3 weeks after your surgery.  Make sure that you call for this appointment within a day or two after you arrive home to insure a convenient appointment time. °10. OTHER INSTRUCTIONS: __________________________________________________________________________________________________________________________ __________________________________________________________________________________________________________________________ °WHEN TO CALL YOUR DOCTOR: °1. Fever over 101.0 °2. Inability to urinate °3. Continued bleeding from incision. °4. Increased pain, redness, or drainage from the incision. °5. Increasing abdominal pain ° °The clinic staff is available to answer your questions during regular business hours.  Please don’t hesitate to call and ask to speak to one of the nurses for clinical concerns.  If you have a medical emergency, go to the nearest emergency room or call 911.  A surgeon from [facility] is always on call at the hospital. °[address], [address], [place], [place]  [address] ? [address] [address], [place], [place]   [address] °[phone] ? [phone] ? FAX [phone] °Web   site: [email or web address] ° ° °General Anesthesia, Adult, Care After °These  instructions provide you with information about caring for yourself after your procedure. Your health care provider may also give you more specific instructions. Your treatment has been planned according to current medical practices, but problems sometimes occur. Call your health care provider if you have any problems or questions after your procedure. °What can I expect after the procedure? °After the procedure, it is common to have: °· Vomiting. °· A sore throat. °· Mental slowness. °It is common to feel: °· Nauseous. °· Cold or shivery. °· Sleepy. °· Tired. °· Sore or achy, even in parts of your body where you did not have surgery. °Follow these instructions at home: °For at least 24 hours after the procedure:  °· Do not: °¨ Participate in activities where you could fall or become injured. °¨ Drive. °¨ Use heavy machinery. °¨ Drink alcohol. °¨ Take sleeping pills or medicines that cause drowsiness. °¨ Make important decisions or sign legal documents. °¨ Take care of children on your own. °· Rest. °Eating and drinking  °· If you vomit, drink water, juice, or soup when you can drink without vomiting. °· Drink enough fluid to keep your urine clear or pale yellow. °· Make sure you have little or no nausea before eating solid foods. °· Follow the diet recommended by your health care provider. °General instructions  °· Have a responsible adult stay with you until you are awake and alert. °· Return to your normal activities as told by your health care provider. Ask your health care provider what activities are safe for you. °· Take over-the-counter and prescription medicines only as told by your health care provider. °· If you smoke, do not smoke without supervision. °· Keep all follow-up visits as told by your health care provider. This is important. °Contact a health care provider if: °· You continue to have nausea or vomiting at home, and medicines are not helpful. °· You cannot drink fluids or start eating  again. °· You cannot urinate after 8-12 hours. °· You develop a skin rash. °· You have fever. °· You have increasing redness at the site of your procedure. °Get help right away if: °· You have difficulty breathing. °· You have chest pain. °· You have unexpected bleeding. °· You feel that you are having a life-threatening or urgent problem. °This information is not intended to replace advice given to you by your health care provider. Make sure you discuss any questions you have with your health care provider. °Document Released: 10/30/2000 Document Revised: 12/27/2015 Document Reviewed: 07/08/2015 °Elsevier Interactive Patient Education © 2017 [employer] ° °

## 2016-09-25 NOTE — Anesthesia Procedure Notes (Signed)
Procedure Name: Intubation Date/Time: 09/25/2016 9:43 AM Performed by: Gillermo Poch, Nuala AlphaKRISTOPHER Pre-anesthesia Checklist: Patient identified, Suction available, Patient being monitored, Emergency Drugs available and Timeout performed Patient Re-evaluated:Patient Re-evaluated prior to inductionOxygen Delivery Method: Circle system utilized Preoxygenation: Pre-oxygenation with 100% oxygen Intubation Type: IV induction Ventilation: Mask ventilation without difficulty Laryngoscope Size: Miller and 2 Grade View: Grade I Tube type: Oral Tube size: 7.0 mm Number of attempts: 1 Airway Equipment and Method: Stylet Secured at: 20 cm Tube secured with: Tape Dental Injury: Teeth and Oropharynx as per pre-operative assessment  Comments: Intubated by Larey SeatJ Slabach SRNA

## 2016-09-25 NOTE — Interval H&P Note (Signed)
History and Physical Interval Note:  09/25/2016 7:36 AM  Charlene Dunn  has presented today for surgery, with the diagnosis of biliary colic  The various methods of treatment have been discussed with the patient and family. After consideration of risks, benefits and other options for treatment, the patient has consented to  Procedure(s): LAPAROSCOPIC CHOLECYSTECTOMY (N/A) as a surgical intervention .  The patient's history has been reviewed, patient examined, no change in status, stable for surgery.  I have reviewed the patient's chart and labs.  Questions were answered to the patient's satisfaction.     Sedra Morfin Lollie SailsA Kenyetta Wimbish

## 2016-09-25 NOTE — Anesthesia Postprocedure Evaluation (Addendum)
Anesthesia Post Note  Patient: Charlene Dunn  Procedure(s) Performed: Procedure(s) (LRB): LAPAROSCOPIC CHOLECYSTECTOMY (N/A)  Patient location during evaluation: PACU Anesthesia Type: General Level of consciousness: awake Pain management: pain level controlled Vital Signs Assessment: post-procedure vital signs reviewed and stable Respiratory status: spontaneous breathing Cardiovascular status: stable Postop Assessment: no signs of nausea or vomiting Anesthetic complications: no        Last Vitals:  Vitals:   09/25/16 1145 09/25/16 1200  BP: (!) 150/78 136/80  Pulse: 68 60  Resp: 19 12  Temp:      Last Pain:  Vitals:   09/25/16 1200  TempSrc:   PainSc: 2    Pain Goal: Patients Stated Pain Goal: 3 (09/25/16 0736)               Elyssa Pendelton JR,JOHN Susann GivensFRANKLIN

## 2016-09-25 NOTE — Transfer of Care (Signed)
Immediate Anesthesia Transfer of Care Note  Patient: Charlene Dunn  Procedure(s) Performed: Procedure(s): LAPAROSCOPIC CHOLECYSTECTOMY (N/A)  Patient Location: PACU  Anesthesia Type:General  Level of Consciousness:  sedated, patient cooperative and responds to stimulation  Airway & Oxygen Therapy:Patient Spontanous Breathing and Patient connected to face mask oxgen  Post-op Assessment:  Report given to PACU RN and Post -op Vital signs reviewed and stable  Post vital signs:  Reviewed and stable  Last Vitals:  Vitals:   09/25/16 0728 09/25/16 1057  BP: 134/82 (!) 141/80  Pulse: 91 86  Resp: 16 14  Temp: 36.8 C (P) 36.6 C    Complications: No apparent anesthesia complications

## 2016-09-25 NOTE — Anesthesia Preprocedure Evaluation (Addendum)
Anesthesia Evaluation  Patient identified by MRN, date of birth, ID band Patient awake    Reviewed: Allergy & Precautions, H&P , NPO status , Patient's Chart, lab work & pertinent test results  Airway Mallampati: II  TM Distance: >3 FB Neck ROM: full    Dental no notable dental hx. (+) Teeth Intact   Pulmonary neg pulmonary ROS,    Pulmonary exam normal        Cardiovascular negative cardio ROS Normal cardiovascular exam     Neuro/Psych negative psych ROS   GI/Hepatic Neg liver ROS, GERD  Medicated and Controlled,  Endo/Other  negative endocrine ROS  Renal/GU negative Renal ROS     Musculoskeletal   Abdominal (+) + obese,   Peds  Hematology negative hematology ROS (+)   Anesthesia Other Findings   Reproductive/Obstetrics negative OB ROS                            Anesthesia Physical Anesthesia Plan  ASA: II  Anesthesia Plan: General   Post-op Pain Management:    Induction: Intravenous  Airway Management Planned: Oral ETT  Additional Equipment:   Intra-op Plan:   Post-operative Plan: Extubation in OR  Informed Consent: I have reviewed the patients History and Physical, chart, labs and discussed the procedure including the risks, benefits and alternatives for the proposed anesthesia with the patient or authorized representative who has indicated his/her understanding and acceptance.   Dental Advisory Given  Plan Discussed with: CRNA and Surgeon  Anesthesia Plan Comments:         Anesthesia Quick Evaluation

## 2016-09-26 ENCOUNTER — Encounter (HOSPITAL_COMMUNITY): Payer: Self-pay | Admitting: Surgery

## 2017-01-12 NOTE — Addendum Note (Signed)
Addendum  created 01/12/17 1022 by Bee Marchiano, MD   Sign clinical note    

## 2017-01-18 ENCOUNTER — Other Ambulatory Visit: Payer: Self-pay | Admitting: Obstetrics & Gynecology

## 2017-01-18 DIAGNOSIS — Z1231 Encounter for screening mammogram for malignant neoplasm of breast: Secondary | ICD-10-CM

## 2017-03-05 ENCOUNTER — Ambulatory Visit
Admission: RE | Admit: 2017-03-05 | Discharge: 2017-03-05 | Disposition: A | Discharge status: Home / Self Care | Payer: BC Managed Care – PPO | Source: Ambulatory Visit | Attending: Obstetrics & Gynecology | Admitting: Obstetrics & Gynecology

## 2017-03-05 DIAGNOSIS — Z1231 Encounter for screening mammogram for malignant neoplasm of breast: Secondary | ICD-10-CM

## 2018-01-30 ENCOUNTER — Other Ambulatory Visit: Payer: Self-pay | Admitting: Obstetrics & Gynecology

## 2018-01-30 DIAGNOSIS — Z1231 Encounter for screening mammogram for malignant neoplasm of breast: Secondary | ICD-10-CM

## 2018-03-08 ENCOUNTER — Ambulatory Visit
Admission: RE | Admit: 2018-03-08 | Discharge: 2018-03-08 | Disposition: A | Discharge status: Home / Self Care | Payer: BC Managed Care – PPO | Source: Ambulatory Visit | Attending: Obstetrics & Gynecology | Admitting: Obstetrics & Gynecology

## 2018-03-08 DIAGNOSIS — Z1231 Encounter for screening mammogram for malignant neoplasm of breast: Secondary | ICD-10-CM

## 2019-02-13 ENCOUNTER — Other Ambulatory Visit: Payer: Self-pay | Admitting: Obstetrics & Gynecology

## 2019-02-13 DIAGNOSIS — Z1231 Encounter for screening mammogram for malignant neoplasm of breast: Secondary | ICD-10-CM

## 2019-03-26 ENCOUNTER — Other Ambulatory Visit: Payer: Self-pay

## 2019-03-26 ENCOUNTER — Ambulatory Visit
Admission: RE | Admit: 2019-03-26 | Discharge: 2019-03-26 | Disposition: A | Discharge status: Home / Self Care | Payer: BC Managed Care – PPO | Source: Ambulatory Visit | Attending: Obstetrics & Gynecology | Admitting: Obstetrics & Gynecology

## 2019-03-26 DIAGNOSIS — Z1231 Encounter for screening mammogram for malignant neoplasm of breast: Secondary | ICD-10-CM

## 2020-04-01 ENCOUNTER — Other Ambulatory Visit: Payer: Self-pay | Admitting: Obstetrics & Gynecology

## 2020-04-01 DIAGNOSIS — Z1231 Encounter for screening mammogram for malignant neoplasm of breast: Secondary | ICD-10-CM

## 2020-04-07 ENCOUNTER — Ambulatory Visit
Admission: RE | Admit: 2020-04-07 | Discharge: 2020-04-07 | Disposition: A | Discharge status: Home / Self Care | Payer: BC Managed Care – PPO | Source: Ambulatory Visit | Attending: Obstetrics & Gynecology | Admitting: Obstetrics & Gynecology

## 2020-04-07 ENCOUNTER — Other Ambulatory Visit: Payer: Self-pay

## 2020-04-07 DIAGNOSIS — Z1231 Encounter for screening mammogram for malignant neoplasm of breast: Secondary | ICD-10-CM

## 2021-02-24 ENCOUNTER — Other Ambulatory Visit: Payer: Self-pay | Admitting: Obstetrics & Gynecology

## 2021-02-24 DIAGNOSIS — Z1231 Encounter for screening mammogram for malignant neoplasm of breast: Secondary | ICD-10-CM

## 2021-04-18 ENCOUNTER — Ambulatory Visit: Payer: BC Managed Care – PPO

## 2021-05-03 ENCOUNTER — Other Ambulatory Visit: Payer: Self-pay

## 2021-05-03 ENCOUNTER — Ambulatory Visit
Admission: RE | Admit: 2021-05-03 | Discharge: 2021-05-03 | Disposition: A | Discharge status: Home / Self Care | Payer: BC Managed Care – PPO | Source: Ambulatory Visit | Attending: Obstetrics & Gynecology | Admitting: Obstetrics & Gynecology

## 2021-05-03 DIAGNOSIS — Z1231 Encounter for screening mammogram for malignant neoplasm of breast: Secondary | ICD-10-CM

## 2021-08-07 HISTORY — PX: COLONOSCOPY: SHX174

## 2021-12-31 ENCOUNTER — Other Ambulatory Visit: Payer: Self-pay

## 2021-12-31 ENCOUNTER — Emergency Department (HOSPITAL_BASED_OUTPATIENT_CLINIC_OR_DEPARTMENT_OTHER)
Admission: EM | Admit: 2021-12-31 | Discharge: 2021-12-31 | Disposition: A | Discharge status: Home / Self Care | Payer: BC Managed Care – PPO | Attending: Emergency Medicine | Admitting: Emergency Medicine

## 2021-12-31 ENCOUNTER — Emergency Department (HOSPITAL_BASED_OUTPATIENT_CLINIC_OR_DEPARTMENT_OTHER): Payer: BC Managed Care – PPO

## 2021-12-31 ENCOUNTER — Encounter (HOSPITAL_BASED_OUTPATIENT_CLINIC_OR_DEPARTMENT_OTHER): Payer: Self-pay | Admitting: Emergency Medicine

## 2021-12-31 DIAGNOSIS — E872 Acidosis, unspecified: Secondary | ICD-10-CM | POA: Diagnosis not present

## 2021-12-31 DIAGNOSIS — Z7982 Long term (current) use of aspirin: Secondary | ICD-10-CM | POA: Diagnosis not present

## 2021-12-31 DIAGNOSIS — R0682 Tachypnea, not elsewhere classified: Secondary | ICD-10-CM | POA: Insufficient documentation

## 2021-12-31 DIAGNOSIS — D72829 Elevated white blood cell count, unspecified: Secondary | ICD-10-CM | POA: Diagnosis not present

## 2021-12-31 DIAGNOSIS — N200 Calculus of kidney: Secondary | ICD-10-CM | POA: Diagnosis not present

## 2021-12-31 DIAGNOSIS — D649 Anemia, unspecified: Secondary | ICD-10-CM | POA: Insufficient documentation

## 2021-12-31 DIAGNOSIS — D75839 Thrombocytosis, unspecified: Secondary | ICD-10-CM | POA: Insufficient documentation

## 2021-12-31 DIAGNOSIS — R109 Unspecified abdominal pain: Secondary | ICD-10-CM | POA: Diagnosis present

## 2021-12-31 LAB — URINALYSIS, ROUTINE W REFLEX MICROSCOPIC
Bilirubin Urine: NEGATIVE
Glucose, UA: NEGATIVE mg/dL
Ketones, ur: NEGATIVE mg/dL
Leukocytes,Ua: NEGATIVE
Nitrite: NEGATIVE
Protein, ur: NEGATIVE mg/dL
RBC / HPF: >50 RBC/hpf — ABNORMAL HIGH (ref 0–5)
Specific Gravity, Urine: 1.01 (ref 1.005–1.030)
pH: 7.5 (ref 5.0–8.0)

## 2021-12-31 LAB — CBC
HCT: 32.4 % — ABNORMAL LOW (ref 36.0–46.0)
Hemoglobin: 10.1 g/dL — ABNORMAL LOW (ref 12.0–15.0)
MCH: 24.2 pg — ABNORMAL LOW (ref 26.0–34.0)
MCHC: 31.2 g/dL (ref 30.0–36.0)
MCV: 77.5 fL — ABNORMAL LOW (ref 80.0–100.0)
Platelets: 443 10*3/uL — ABNORMAL HIGH (ref 150–400)
RBC: 4.18 MIL/uL (ref 3.87–5.11)
RDW: 16 % — ABNORMAL HIGH (ref 11.5–15.5)
WBC: 11.9 10*3/uL — ABNORMAL HIGH (ref 4.0–10.5)
nRBC: 0 % (ref 0.0–0.2)

## 2021-12-31 LAB — BASIC METABOLIC PANEL
Anion gap: 12 (ref 5–15)
BUN: 14 mg/dL (ref 6–20)
CO2: 20 mmol/L — ABNORMAL LOW (ref 22–32)
Calcium: 8.6 mg/dL — ABNORMAL LOW (ref 8.9–10.3)
Chloride: 105 mmol/L (ref 98–111)
Creatinine, Ser: 0.68 mg/dL (ref 0.44–1.00)
GFR, Estimated: >60 mL/min (ref >60)
Glucose, Bld: 141 mg/dL — ABNORMAL HIGH (ref 70–99)
Potassium: 3.4 mmol/L — ABNORMAL LOW (ref 3.5–5.1)
Sodium: 137 mmol/L (ref 135–145)

## 2021-12-31 LAB — LIPASE, BLOOD: Lipase: 29 U/L (ref 11–51)

## 2021-12-31 MED ORDER — OXYCODONE-ACETAMINOPHEN 5-325 MG PO TABS
1.0000 | ORAL_TABLET | Freq: Once | ORAL | Status: AC
  Administered 2021-12-31: 1 via ORAL
  Filled 2021-12-31: qty 1

## 2021-12-31 MED ORDER — OXYCODONE-ACETAMINOPHEN 5-325 MG PO TABS: 1.0000 | ORAL_TABLET | Freq: Four times a day (QID) | ORAL | 0 refills | Status: DC | PRN

## 2021-12-31 MED ORDER — ONDANSETRON HCL 4 MG/2ML IJ SOLN
INTRAMUSCULAR | Status: AC
  Administered 2021-12-31: 4 mg via INTRAVENOUS
  Filled 2021-12-31: qty 2

## 2021-12-31 MED ORDER — KETOROLAC TROMETHAMINE 15 MG/ML IJ SOLN
15.0000 mg | Freq: Once | INTRAMUSCULAR | Status: AC
  Administered 2021-12-31: 15 mg via INTRAVENOUS
  Filled 2021-12-31: qty 1

## 2021-12-31 MED ORDER — TAMSULOSIN HCL 0.4 MG PO CAPS: 0.4000 mg | ORAL_CAPSULE | Freq: Every day | ORAL | 0 refills | Status: DC

## 2021-12-31 MED ORDER — MORPHINE SULFATE (PF) 4 MG/ML IV SOLN
4.0000 mg | Freq: Once | INTRAVENOUS | Status: AC
  Administered 2021-12-31: 4 mg via INTRAVENOUS
  Filled 2021-12-31: qty 1

## 2021-12-31 MED ORDER — ONDANSETRON HCL 4 MG PO TABS: 4.0000 mg | ORAL_TABLET | Freq: Four times a day (QID) | ORAL | 0 refills | Status: DC

## 2021-12-31 MED ORDER — FENTANYL CITRATE PF 50 MCG/ML IJ SOSY
50.0000 ug | PREFILLED_SYRINGE | INTRAMUSCULAR | Status: DC | PRN
  Administered 2021-12-31: 50 ug via INTRAVENOUS
  Filled 2021-12-31: qty 1

## 2021-12-31 NOTE — ED Notes (Signed)
Patient transported to CT 

## 2021-12-31 NOTE — ED Provider Notes (Signed)
[facility] DEPT Provider Note   CSN: [id number] Arrival date & time: 12/31/21  1645     History  Chief Complaint  Patient presents with   Flank Pain    Charlene Dunn is a 61 y.o. female with past medical history significant for previous kidney stones, gallstones with prior cholecystectomy, hysterectomy who presents with concern for acute onset left flank pain that began at around 230 this afternoon associated with nausea.  Patient reports it feels similar to previous kidney stones, and stabbing in nature.  She reports that it is located on her left flank, with some radiation to the groin.  She denies dysuria, hematuria.  Patient did not do anything for pain prior to arrival.   Flank Pain      Home Medications Prior to Admission medications   Medication Sig Start Date End Date Taking? Authorizing Provider  ondansetron (ZOFRAN) 4 MG tablet Take 1 tablet (4 mg total) by mouth every 6 (six) hours. 12/31/21  Yes Donevin Sainsbury H, PA-C  oxyCODONE-acetaminophen (PERCOCET/ROXICET) 5-325 MG tablet Take 1 tablet by mouth every 6 (six) hours as needed for severe pain. 12/31/21  Yes Valicia Rief H, PA-C  tamsulosin (FLOMAX) 0.4 MG CAPS capsule Take 1 capsule (0.4 mg total) by mouth daily. 12/31/21  Yes Bernardine Langworthy H, PA-C  Aspirin-Salicylamide-Caffeine (BC FAST PAIN RELIEF) 650-195-33.3 MG PACK Take 1 packet by mouth every 8 (eight) hours as needed (for pain/headache.).    [provider]  calcium carbonate (CALCIUM 600) 600 MG TABS tablet Take 1,200 mg by mouth daily.     [provider]  estradiol (ESTRACE) 2 MG tablet Take 2 mg by mouth daily.    [provider]  HYDROcodone-acetaminophen (NORCO/VICODIN) 5-325 MG tablet Take 1 tablet by mouth every 6 (six) hours as needed for moderate pain. 09/25/16   Clovis Riley, MD  ibuprofen (ADVIL,MOTRIN) 200 MG tablet Take 400 mg by mouth every 8 (eight) hours as needed (for  pain.).    [provider]  Multiple Vitamin (MULTIVITAMIN WITH MINERALS) TABS tablet Take 1 tablet by mouth daily.    [provider]  nystatin-triamcinolone ointment (MYCOLOG) Apply 1 application topically 2 (two) times daily as needed. For yeast reactions (stomach/thigh areas) 07/04/16   [provider]  omeprazole (PRILOSEC) 40 MG capsule Take 40 mg by mouth daily. 08/29/16   [provider]      Allergies    Cinnamon, Codeine, and Sulfa antibiotics    Review of Systems   Review of Systems  Genitourinary:  Positive for flank pain.  All other systems reviewed and are negative.  Physical Exam Updated Vital Signs BP 138/65   Pulse 83   Temp 98.4 F (36.9 C) (Oral)   Resp 20   SpO2 94%  Physical Exam Vitals and nursing note reviewed.  Constitutional:      General: She is in acute distress.     Appearance: Normal appearance.  HENT:     Head: Normocephalic and atraumatic.  Eyes:     General:        Right eye: No discharge.        Left eye: No discharge.  Cardiovascular:     Rate and Rhythm: Normal rate and regular rhythm.     Heart sounds: No murmur heard.   No friction rub. No gallop.  Pulmonary:     Effort: Pulmonary effort is normal.     Breath sounds: Normal breath sounds.     Comments:  Intermittent tachypnea secondary to pain Abdominal:     General: Bowel sounds are normal.     Palpations: Abdomen is soft.     Comments: Tenderness to palpation suprapubically and of the left flank.  Positive CVA tenderness on the left.  Skin:    General: Skin is warm and dry.     Capillary Refill: Capillary refill takes less than 2 seconds.  Neurological:     Mental Status: She is alert and oriented to person, place, and time.  Psychiatric:        Mood and Affect: Mood normal.        Behavior: Behavior normal.    ED Results / Procedures / Treatments   Labs (all labs ordered are listed, but only abnormal results are displayed) Labs Reviewed   URINALYSIS, ROUTINE W REFLEX MICROSCOPIC - Abnormal; Notable for the following components:      Result Value   Hgb urine dipstick LARGE (*)    RBC / HPF >50 (*)    All other components within normal limits  BASIC METABOLIC PANEL - Abnormal; Notable for the following components:   Potassium 3.4 (*)    CO2 20 (*)    Glucose, Bld 141 (*)    Calcium 8.6 (*)    All other components within normal limits  CBC - Abnormal; Notable for the following components:   WBC 11.9 (*)    Hemoglobin 10.1 (*)    HCT 32.4 (*)    MCV 77.5 (*)    MCH 24.2 (*)    RDW 16.0 (*)    Platelets 443 (*)    All other components within normal limits  URINE CULTURE  LIPASE, BLOOD    EKG None  Radiology CT Renal Stone Study  Result Date: 12/31/2021 CLINICAL DATA:  Left flank pain, history of kidney stones EXAM: CT ABDOMEN AND PELVIS WITHOUT CONTRAST TECHNIQUE: Multidetector CT imaging of the abdomen and pelvis was performed following the standard protocol without IV contrast. RADIATION DOSE REDUCTION: This exam was performed according to the departmental dose-optimization program which includes automated exposure control, adjustment of the mA and/or kV according to patient size and/or use of iterative reconstruction technique. COMPARISON:  None Available. FINDINGS: Lower chest: No acute abnormality. Moderate hiatal hernia with intrathoracic position of the gastric fundus. Hepatobiliary: No focal liver abnormality is seen. Status post cholecystectomy. No biliary dilatation. Pancreas: Unremarkable. No pancreatic ductal dilatation or surrounding inflammatory changes. Spleen: Normal in size without significant abnormality. Adrenals/Urinary Tract: Adrenal glands are unremarkable. There is a 0.3 cm calculus in the most proximal left ureter, near the ureteropelvic junction, with mild associated left hydronephrosis. Additional nonobstructive calculus of the inferior pole of the right kidney. No right-sided calculi, right  ureteral calculi, or hydronephrosis. Bladder is unremarkable. Stomach/Bowel: Stomach is within normal limits. Appendix appears normal. No evidence of bowel wall thickening, distention, or inflammatory changes. Vascular/Lymphatic: No significant vascular findings are present. No enlarged abdominal or pelvic lymph nodes. Reproductive: Status post hysterectomy. Fluid attenuation cyst of the right ovary measuring 4.0 cm. Other: No abdominal wall hernia or abnormality. No ascites. Musculoskeletal: No acute or significant osseous findings. IMPRESSION: 1. There is a 0.3 cm calculus in the most proximal left ureter, near the ureteropelvic junction, with mild associated left hydronephrosis. 2. Additional nonobstructive calculus of the inferior pole of the right kidney. No right-sided calculi or right-sided hydronephrosis. 3. Moderate hiatal hernia. 4. Fluid attenuation cyst of the right ovary measuring 4.0 cm. Although statistically likely to be benign, recommend follow-up  US in 6-12 months. Note: This recommendation does not apply to premenarchal patients and to those with increased risk (genetic, family history, elevated tumor markers or other high-risk factors) of ovarian cancer. Reference: JACR 2020 Feb; 17(2):248-254 Electronically Signed   By: Delanna Ahmadi M.D.   On: 12/31/2021 17:35    Procedures Procedures    Medications Ordered in ED Medications  fentaNYL (SUBLIMAZE) injection 50 mcg (50 mcg Intravenous Given 12/31/21 1712)  ondansetron (ZOFRAN) 4 MG/2ML injection (4 mg Intravenous Given 12/31/21 1713)  morphine (PF) 4 MG/ML injection 4 mg (4 mg Intravenous Given 12/31/21 1756)  ketorolac (TORADOL) 15 MG/ML injection 15 mg (15 mg Intravenous Given 12/31/21 1847)  oxyCODONE-acetaminophen (PERCOCET/ROXICET) 5-325 MG per tablet 1 tablet (1 tablet Oral Given 12/31/21 1848)    ED Course/ Medical Decision Making/ A&P                           Medical Decision Making Amount and/or Complexity of Data  Reviewed Labs: ordered. Radiology: ordered.  Risk Prescription drug management.   This patient is a 61 y.o. female who presents to the ED for concern of acute onset left flank pain, nausea, vomiting, this involves an extensive number of treatment options, and is a complaint that carries with it a high risk of complications and morbidity. The emergent differential diagnosis prior to evaluation includes, but is not limited to, Vitas, nephrolithiasis, UTI, diverticulitis, other acute intra-abdominal or urinary abnormality, less clinical concern for atypical ACS, pneumonia, back injury, musculoskeletal pain based on patient's presentation, however these diagnoses were also considered.   This is not an exhaustive differential.   Past Medical History / Co-morbidities / Social History: History of previous nephrolithiasis, obesity  Additional history: Chart reviewed. Pertinent results include: Reviewed outpatient GI, ENT visits  Physical Exam: Physical exam performed. The pertinent findings include: Patient with positive CVA tenderness on the left, as well as some tenderness palpation of the left flank and suprapubic region.  Her vital signs are stable, she is in some acute distress on arrival but improved after pain and nausea medication.  Lab Tests: I ordered, and personally interpreted labs.  The pertinent results include: Urinalysis with large hemoglobin but no evidence of superimposed urinary tract infection, no white blood cells nitrate, leukocytes noted.  BMP is overall unremarkable, notably normal kidney function, creatinine 0.68.  She does have a mild acidemia with bicarb of 20 without anion gap.  Potassium 3.4, encouraged oral repletion.  CBC notable for mild leukocytosis, 11.9 , favored secondary to pain.  She does have a mild anemia hemoglobin 10.1.  No recent baseline on file, however patient has no report of significant blood loss.  She does have a mild elevation in platelets at 443,  favor as an acute phase reactant.   Imaging Studies: I ordered imaging studies including CT stone study. I independently visualized and interpreted imaging which showed 0.3 cm stone on the left with associated hydronephrosis. I agree with the radiologist interpretation.   Medications: I ordered medication including fentanyl, morphine, Zofran for pain. Reevaluation of the patient after these medicines showed that the patient improved. I have reviewed the patients home medicines and have made adjustments as needed.  Disposition: After consideration of the diagnostic results and the patients response to treatment, I feel that patient's presentation is consistent with simple left-sided nephrolithiasis, her stone is 3 mm and should pass on its own.  Provided her with a prescription for Percocet, Zofran, Flomax  and encouraged her to strain her urine.  Provided follow-up with [facility] as needed.  Patient discharged in stable condition at this time, extensive return precautions given.   I discussed this case with my attending physician Dr. Regenia Skeeter who cosigned this note including patient's presenting symptoms, physical exam, and planned diagnostics and interventions. Attending physician stated agreement with plan or made changes to plan which were implemented.    Final Clinical Impression(s) / ED Diagnoses Final diagnoses:  Kidney stone    Rx / DC Orders ED Discharge Orders          Ordered    ondansetron (ZOFRAN) 4 MG tablet  Every 6 hours        12/31/21 1836    oxyCODONE-acetaminophen (PERCOCET/ROXICET) 5-325 MG tablet  Every 6 hours PRN        12/31/21 1836    tamsulosin (FLOMAX) 0.4 MG CAPS capsule  Daily        12/31/21 1836              Anselmo Pickler, PA-C 12/31/21 1942    Sherwood Gambler, MD 01/03/22 [phone]

## 2021-12-31 NOTE — Discharge Instructions (Addendum)
You were seen in the emergency department and found to have a kidney stone.  Please take ibuprofen as needed for pain.  We are sending you home with multiple medications to assist with passing the stone and for residual pain/nausea:  -Flomax-this is a medication to help pass the stone, it allows urine to exit the body more freely.  Please take this once daily with a meal.  -Percocet-this is a narcotic/controlled substance medication that has potential addicting qualities.  We recommend that you take 1-2 tablets every 6 hours as needed for severe pain.  Do not drive or operate heavy machinery when taking this medicine as it can be sedating. Do not drink alcohol or take other sedating medications when taking this medicine for safety reasons.  Keep this out of reach of small children.  Please be aware this medicine has Tylenol in it (325 mg/tab) do not exceed the maximum dose of Tylenol in a day per over the counter recommendations should you decide to supplement with Tylenol over the counter.   -Zofran-this is an antinausea medication, you may take this every 8 hours as needed for nausea and vomiting, please allow the tablet to dissolve underneath of your tongue.   We have prescribed you new medication(s) today. Discuss the medications prescribed today with your pharmacist as they can have adverse effects and interactions with your other medicines including over the counter and prescribed medications. Seek medical evaluation if you start to experience new or abnormal symptoms after taking one of these medicines, seek care immediately if you start to experience difficulty breathing, feeling of your throat closing, facial swelling, or rash as these could be indications of a more serious allergic reaction  Please follow-up with the urology group provided in your discharge instructions within 3 to 5 days.  Return to the ER for new or worsening symptoms including but not limited to worsening pain not controlled  by these medicines, inability to keep fluids down, fever, or any other concerns that you may have.   

## 2021-12-31 NOTE — ED Triage Notes (Signed)
Pt presents with 2 hours left back/flank pain .has had kidney stones in the past,feels the same. No recent illness.

## 2022-01-02 LAB — URINE CULTURE: Culture: <10000 — AB

## 2022-01-10 ENCOUNTER — Other Ambulatory Visit: Payer: Self-pay | Admitting: Urology

## 2022-01-12 ENCOUNTER — Encounter (HOSPITAL_COMMUNITY)
Admission: RE | Admit: 2022-01-12 | Discharge: 2022-01-12 | Disposition: A | Discharge status: Home / Self Care | Payer: BC Managed Care – PPO | Source: Ambulatory Visit | Attending: Urology | Admitting: Urology

## 2022-01-12 ENCOUNTER — Other Ambulatory Visit: Payer: Self-pay

## 2022-01-12 ENCOUNTER — Encounter (HOSPITAL_COMMUNITY): Payer: Self-pay

## 2022-01-12 DIAGNOSIS — Z01812 Encounter for preprocedural laboratory examination: Secondary | ICD-10-CM | POA: Insufficient documentation

## 2022-01-12 DIAGNOSIS — N289 Disorder of kidney and ureter, unspecified: Secondary | ICD-10-CM | POA: Insufficient documentation

## 2022-01-12 HISTORY — DX: Personal history of urinary calculi: Z87.442

## 2022-01-12 LAB — BASIC METABOLIC PANEL
Anion gap: 7 (ref 5–15)
BUN: 14 mg/dL (ref 6–20)
CO2: 24 mmol/L (ref 22–32)
Calcium: 9.1 mg/dL (ref 8.9–10.3)
Chloride: 108 mmol/L (ref 98–111)
Creatinine, Ser: 0.62 mg/dL (ref 0.44–1.00)
GFR, Estimated: >60 mL/min (ref >60)
Glucose, Bld: 98 mg/dL (ref 70–99)
Potassium: 3.6 mmol/L (ref 3.5–5.1)
Sodium: 139 mmol/L (ref 135–145)

## 2022-01-12 LAB — CBC
HCT: 33.5 % — ABNORMAL LOW (ref 36.0–46.0)
Hemoglobin: 10.3 g/dL — ABNORMAL LOW (ref 12.0–15.0)
MCH: 24.9 pg — ABNORMAL LOW (ref 26.0–34.0)
MCHC: 30.7 g/dL (ref 30.0–36.0)
MCV: 80.9 fL (ref 80.0–100.0)
Platelets: 425 10*3/uL — ABNORMAL HIGH (ref 150–400)
RBC: 4.14 MIL/uL (ref 3.87–5.11)
RDW: 16.6 % — ABNORMAL HIGH (ref 11.5–15.5)
WBC: 8.4 10*3/uL (ref 4.0–10.5)
nRBC: 0 % (ref 0.0–0.2)

## 2022-01-12 NOTE — Progress Notes (Signed)
Anesthesia note:  Bowel prep reminder:NA  PCP - Dr. Carlota Raspberry Cardiologist -none Other-   Chest x-ray - no EKG - no Stress Test - no ECHO - no Cardiac Cath - NA  Pacemaker/ICD device last checked:NA  Sleep Study - no CPAP -   Pt is pre diabetic-NA Fasting Blood Sugar -  Checks Blood Sugar _____  Blood Thinner:NA Blood Thinner Instructions: Aspirin Instructions: Last Dose:  Anesthesia review: no  Patient denies shortness of breath, fever, cough and chest pain at PAT appointment Pt has no SOB with any activities. She does have PONV  Patient verbalized understanding of instructions that were given to them at the PAT appointment. Patient was also instructed that they will need to review over the PAT instructions again at home before surgery. yes

## 2022-01-12 NOTE — Patient Instructions (Addendum)
DUE TO COVID-19 ONLY TWO VISITORS  (aged 59 and older)  ARE ALLOWED TO COME WITH YOU AND STAY IN THE WAITING ROOM ONLY DURING PRE OP AND PROCEDURE.   **NO VISITORS ARE ALLOWED IN THE SHORT STAY AREA OR RECOVERY ROOM!!**  IF YOU WILL BE ADMITTED INTO THE HOSPITAL YOU ARE ALLOWED ONLY FOUR SUPPORT PEOPLE DURING VISITATION HOURS ONLY (7 AM -8PM)   The support person(s) must pass our screening, gel in and out, and wear a mask at all times, including in the patient's room. Patients must also wear a mask when staff or their support person are in the room. Visitors GUEST BADGE MUST BE WORN VISIBLY  One adult visitor may remain with you overnight and MUST be in the room by 8 P.M.     Your procedure is scheduled on: 01/16/22   Report to [facility] Main Entrance    Report to admitting at  2:15 pm    Call this number if you have problems the morning of surgery [phone]   Do not eat food :After Midnight.   After Midnight you may have the following liquids until _10:00_____ AM/ DAY OF SURGERY  Water Black Coffee (sugar ok, NO MILK/CREAM OR CREAMERS)  Tea (sugar ok, NO MILK/CREAM OR CREAMERS) regular and decaf                             Plain Jell-O (NO RED)                                           Fruit ices (not with fruit pulp, NO RED)                                     Popsicles (NO RED)                                                                  Juice: apple, WHITE grape, WHITE cranberry Sports drinks like Gatorade (NO RED) Clear broth(vegetable,chicken,beef)          If you have questions, please contact your surgeon's office.   Oral Hygiene is also important to reduce your risk of infection.                                    Remember - BRUSH YOUR TEETH THE MORNING OF SURGERY WITH YOUR REGULAR TOOTHPASTE   Do NOT smoke after Midnight   Take these medicines the morning of surgery with A SIP OF WATER: Tamsulosin, Omeprazole  Bring CPAP mask and tubing day of  surgery.                              You may not have any metal on your body including hair pins, jewelry, and body piercing             Do not wear make-up, lotions, powders, perfumes/cologne, or deodorant  Do not wear nail polish including gel and S&S, artificial/acrylic nails, or any other type of covering on natural nails including finger and toenails. If you have artificial nails, gel coating, etc. that needs to be removed by a nail salon please have this removed prior to surgery or surgery may need to be canceled/ delayed if the surgeon/ anesthesia feels like they are unable to be safely monitored.   Do not shave  48 hours prior to surgery.     Do not bring valuables to the hospital. [facility] IS NOT             RESPONSIBLE   FOR VALUABLES.   Contacts, dentures or bridgework may not be worn into surgery.   DO NOT BRING YOUR HOME MEDICATIONS TO THE HOSPITAL. PHARMACY WILL DISPENSE MEDICATIONS LISTED ON YOUR MEDICATION LIST TO YOU DURING YOUR ADMISSION IN THE HOSPITAL!    Patients discharged on the day of surgery will not be allowed to drive home.  Someone NEEDS to stay with you for the first 24 hours after anesthesia.   Special Instructions: Bring a copy of your healthcare power of attorney and living will documents   the day of surgery if you haven't scanned them before.              Please read over the following fact sheets you were given: IF YOU HAVE QUESTIONS ABOUT YOUR PRE-OP INSTRUCTIONS PLEASE CALL [phone]     [facility] - Preparing for Surgery Before surgery, you can play an important role.  Because skin is not sterile, your skin needs to be as free of germs as possible.  You can reduce the number of germs on your skin by washing with CHG (chlorahexidine gluconate) soap before surgery.  CHG is an antiseptic cleaner which kills germs and bonds with the skin to continue killing germs even after washing. Please DO NOT use if you have an allergy to CHG or antibacterial  soaps.  If your skin becomes reddened/irritated stop using the CHG and inform your nurse when you arrive at Short Stay. Do not shave (including legs and underarms) for at least 48 hours prior to the first CHG shower.   Please follow these instructions carefully:  1.  Shower with CHG Soap the night before surgery and the  morning of Surgery.  2.  If you choose to wash your hair, wash your hair first as usual with your  normal  shampoo.  3.  After you shampoo, rinse your hair and body thoroughly to remove the  shampoo.                            4.  Use CHG as you would any other liquid soap.  You can apply chg directly  to the skin and wash                       Gently with a scrungie or clean washcloth.  5.  Apply the CHG Soap to your body ONLY FROM THE NECK DOWN.   Do not use on face/ open                           Wound or open sores. Avoid contact with eyes, ears mouth and genitals (private parts).  Wash face,  Genitals (private parts) with your normal soap.             6.  Wash thoroughly, paying special attention to the area where your surgery  will be performed.  7.  Thoroughly rinse your body with warm water from the neck down.  8.  DO NOT shower/wash with your normal soap after using and rinsing off  the CHG Soap.                9.  Pat yourself dry with a clean towel.            10.  Wear clean pajamas.            11.  Place clean sheets on your bed the night of your first shower and do not  sleep with pets. Day of Surgery : Do not apply any lotions/deodorants the morning of surgery.  Please wear clean clothes to the hospital/surgery center.  FAILURE TO FOLLOW THESE INSTRUCTIONS MAY RESULT IN THE CANCELLATION OF YOUR SURGERY    ________________________________________________________________________

## 2022-01-15 NOTE — Anesthesia Preprocedure Evaluation (Signed)
Anesthesia Evaluation  Patient identified by MRN, date of birth, ID band Patient awake    Reviewed: Allergy & Precautions, NPO status , Patient's Chart, lab work & pertinent test results  History of Anesthesia Complications (+) PONV and history of anesthetic complications  Airway Mallampati: II  TM Distance: >3 FB Neck ROM: Full    Dental no notable dental hx. (+) Teeth Intact, Dental Advisory Given   Pulmonary neg pulmonary ROS,    Pulmonary exam normal breath sounds clear to auscultation       Cardiovascular Exercise Tolerance: Good negative cardio ROS Normal cardiovascular exam Rhythm:Regular Rate:Normal     Neuro/Psych  Headaches,    GI/Hepatic GERD  ,Lab Results      Component                Value               Date                      CREATININE               0.62                01/12/2022               K                        3.6                 01/12/2022                   Endo/Other  Morbid obesity  Renal/GU Renal disease     Musculoskeletal negative musculoskeletal ROS (+)   Abdominal   Peds  Hematology Lab Results      Component                Value               Date                           HGB                      10.3 (L)            01/12/2022                HCT                      33.5 (L)            01/12/2022                      PLT                      425 (H)             01/12/2022              Anesthesia Other Findings All: Sulfa. codeine  Reproductive/Obstetrics                            Anesthesia Physical Anesthesia Plan  ASA: 3  Anesthesia Plan: General   Post-op Pain Management: Dilaudid IV and Ofirmev IV (intra-op)*   Induction: Intravenous  PONV Risk Score and Plan: 4  or greater and Treatment may vary due to age or medical condition, Midazolam, Dexamethasone, Ondansetron and Scopolamine patch - Pre-op  Airway Management Planned:  LMA  Additional Equipment: None  Intra-op Plan:   Post-operative Plan:   Informed Consent: I have reviewed the patients History and Physical, chart, labs and discussed the procedure including the risks, benefits and alternatives for the proposed anesthesia with the patient or authorized representative who has indicated his/her understanding and acceptance.     Dental advisory given  Plan Discussed with: CRNA and Anesthesiologist  Anesthesia Plan Comments:       Anesthesia Quick Evaluation

## 2022-01-16 ENCOUNTER — Ambulatory Visit (HOSPITAL_COMMUNITY): Payer: BC Managed Care – PPO | Admitting: Certified Registered"

## 2022-01-16 ENCOUNTER — Ambulatory Visit (HOSPITAL_COMMUNITY): Payer: BC Managed Care – PPO

## 2022-01-16 ENCOUNTER — Encounter (HOSPITAL_COMMUNITY)
Admission: RE | Disposition: A | Discharge status: Home / Self Care | Payer: Self-pay | Source: Home / Self Care | Attending: Urology

## 2022-01-16 ENCOUNTER — Encounter (HOSPITAL_COMMUNITY): Payer: Self-pay | Admitting: Urology

## 2022-01-16 ENCOUNTER — Ambulatory Visit (HOSPITAL_COMMUNITY)
Admission: RE | Admit: 2022-01-16 | Discharge: 2022-01-16 | Disposition: A | Discharge status: Home / Self Care | Payer: BC Managed Care – PPO | Attending: Urology | Admitting: Urology

## 2022-01-16 DIAGNOSIS — N132 Hydronephrosis with renal and ureteral calculous obstruction: Secondary | ICD-10-CM | POA: Insufficient documentation

## 2022-01-16 DIAGNOSIS — Z6841 Body Mass Index (BMI) 40.0 and over, adult: Secondary | ICD-10-CM | POA: Diagnosis not present

## 2022-01-16 DIAGNOSIS — K219 Gastro-esophageal reflux disease without esophagitis: Secondary | ICD-10-CM | POA: Insufficient documentation

## 2022-01-16 DIAGNOSIS — N202 Calculus of kidney with calculus of ureter: Secondary | ICD-10-CM | POA: Diagnosis present

## 2022-01-16 DIAGNOSIS — N200 Calculus of kidney: Secondary | ICD-10-CM

## 2022-01-16 DIAGNOSIS — N289 Disorder of kidney and ureter, unspecified: Secondary | ICD-10-CM

## 2022-01-16 HISTORY — PX: CYSTOSCOPY/URETEROSCOPY/HOLMIUM LASER/STENT PLACEMENT: SHX6546

## 2022-01-16 SURGERY — CYSTOSCOPY/URETEROSCOPY/HOLMIUM LASER/STENT PLACEMENT
Anesthesia: General | Laterality: Left

## 2022-01-16 MED ORDER — FENTANYL CITRATE (PF) 100 MCG/2ML IJ SOLN
INTRAMUSCULAR | Status: AC
  Filled 2022-01-16: qty 2

## 2022-01-16 MED ORDER — CEFAZOLIN SODIUM-DEXTROSE 2-4 GM/100ML-% IV SOLN
INTRAVENOUS | Status: AC
  Filled 2022-01-16: qty 100

## 2022-01-16 MED ORDER — MIDAZOLAM HCL 2 MG/2ML IJ SOLN
INTRAMUSCULAR | Status: AC
  Filled 2022-01-16: qty 2

## 2022-01-16 MED ORDER — 0.9 % SODIUM CHLORIDE (POUR BTL) OPTIME
TOPICAL | Status: DC | PRN
  Administered 2022-01-16: 1000 mL

## 2022-01-16 MED ORDER — DEXMEDETOMIDINE (PRECEDEX) IN NS 20 MCG/5ML (4 MCG/ML) IV SYRINGE
PREFILLED_SYRINGE | INTRAVENOUS | Status: AC
  Filled 2022-01-16: qty 5

## 2022-01-16 MED ORDER — FENTANYL CITRATE (PF) 100 MCG/2ML IJ SOLN
INTRAMUSCULAR | Status: DC | PRN
  Administered 2022-01-16: 50 ug via INTRAVENOUS
  Administered 2022-01-16: 25 ug via INTRAVENOUS
  Administered 2022-01-16: 50 ug via INTRAVENOUS
  Administered 2022-01-16 (×3): 25 ug via INTRAVENOUS

## 2022-01-16 MED ORDER — DEXAMETHASONE SODIUM PHOSPHATE 10 MG/ML IJ SOLN
INTRAMUSCULAR | Status: DC | PRN
  Administered 2022-01-16: 4 mg via INTRAVENOUS

## 2022-01-16 MED ORDER — AMISULPRIDE (ANTIEMETIC) 5 MG/2ML IV SOLN: 10.0000 mg | Freq: Once | INTRAVENOUS | Status: DC | PRN

## 2022-01-16 MED ORDER — ORAL CARE MOUTH RINSE: 15.0000 mL | Freq: Once | OROMUCOSAL | Status: AC

## 2022-01-16 MED ORDER — PROPOFOL 10 MG/ML IV BOLUS
INTRAVENOUS | Status: DC | PRN
  Administered 2022-01-16: 130 mg via INTRAVENOUS

## 2022-01-16 MED ORDER — DOCUSATE SODIUM 100 MG PO CAPS
100.0000 mg | ORAL_CAPSULE | Freq: Every day | ORAL | 0 refills | Status: AC | PRN
Start: 2022-01-16 — End: ?

## 2022-01-16 MED ORDER — HYDROMORPHONE HCL 1 MG/ML IJ SOLN: 0.2500 mg | INTRAMUSCULAR | Status: DC | PRN

## 2022-01-16 MED ORDER — OXYCODONE HCL 5 MG PO TABS: 5.0000 mg | ORAL_TABLET | Freq: Once | ORAL | Status: DC | PRN

## 2022-01-16 MED ORDER — OXYCODONE-ACETAMINOPHEN 5-325 MG PO TABS: 1.0000 | ORAL_TABLET | ORAL | 0 refills | Status: AC | PRN

## 2022-01-16 MED ORDER — ONDANSETRON HCL 4 MG/2ML IJ SOLN: 4.0000 mg | Freq: Once | INTRAMUSCULAR | Status: DC | PRN

## 2022-01-16 MED ORDER — CEPHALEXIN 500 MG PO CAPS: 500.0000 mg | ORAL_CAPSULE | Freq: Two times a day (BID) | ORAL | 0 refills | Status: AC

## 2022-01-16 MED ORDER — LIDOCAINE 2% (20 MG/ML) 5 ML SYRINGE
INTRAMUSCULAR | Status: DC | PRN
  Administered 2022-01-16: 100 mg via INTRAVENOUS

## 2022-01-16 MED ORDER — ACETAMINOPHEN 10 MG/ML IV SOLN: 1000.0000 mg | Freq: Once | INTRAVENOUS | Status: DC | PRN

## 2022-01-16 MED ORDER — SCOPOLAMINE 1 MG/3DAYS TD PT72
1.0000 | MEDICATED_PATCH | TRANSDERMAL | Status: DC
  Administered 2022-01-16: 1.5 mg via TRANSDERMAL
  Filled 2022-01-16: qty 1

## 2022-01-16 MED ORDER — MIDAZOLAM HCL 2 MG/2ML IJ SOLN
INTRAMUSCULAR | Status: DC | PRN
  Administered 2022-01-16: 2 mg via INTRAVENOUS

## 2022-01-16 MED ORDER — ONDANSETRON HCL 4 MG/2ML IJ SOLN
INTRAMUSCULAR | Status: DC | PRN
  Administered 2022-01-16: 4 mg via INTRAVENOUS

## 2022-01-16 MED ORDER — SODIUM CHLORIDE 0.9 % IR SOLN
Status: DC | PRN
  Administered 2022-01-16: 3000 mL via INTRAVESICAL

## 2022-01-16 MED ORDER — DEXMEDETOMIDINE (PRECEDEX) IN NS 20 MCG/5ML (4 MCG/ML) IV SYRINGE
PREFILLED_SYRINGE | INTRAVENOUS | Status: DC | PRN
  Administered 2022-01-16: 4 ug via INTRAVENOUS
  Administered 2022-01-16: 8 ug via INTRAVENOUS

## 2022-01-16 MED ORDER — LACTATED RINGERS IV SOLN: INTRAVENOUS | Status: DC

## 2022-01-16 MED ORDER — CHLORHEXIDINE GLUCONATE 0.12 % MT SOLN
15.0000 mL | Freq: Once | OROMUCOSAL | Status: AC
  Administered 2022-01-16: 15 mL via OROMUCOSAL

## 2022-01-16 MED ORDER — PROPOFOL 10 MG/ML IV BOLUS
INTRAVENOUS | Status: AC
  Filled 2022-01-16: qty 20

## 2022-01-16 MED ORDER — IOHEXOL 300 MG/ML  SOLN
INTRAMUSCULAR | Status: DC | PRN
  Administered 2022-01-16: 10 mL

## 2022-01-16 MED ORDER — OXYCODONE HCL 5 MG/5ML PO SOLN: 5.0000 mg | Freq: Once | ORAL | Status: DC | PRN

## 2022-01-16 MED ORDER — DEXAMETHASONE SODIUM PHOSPHATE 10 MG/ML IJ SOLN
INTRAMUSCULAR | Status: AC
  Filled 2022-01-16: qty 1

## 2022-01-16 MED ORDER — ONDANSETRON HCL 4 MG/2ML IJ SOLN
INTRAMUSCULAR | Status: AC
  Filled 2022-01-16: qty 2

## 2022-01-16 SURGICAL SUPPLY — 22 items
BAG URO CATCHER STRL LF (MISCELLANEOUS) ×2 IMPLANT
BASKET ZERO TIP NITINOL 2.4FR (BASKET) ×1 IMPLANT
BSKT STON RTRVL ZERO TP 2.4FR (BASKET) ×1
CATH URETL OPEN 5X70 (CATHETERS) ×2 IMPLANT
CLOTH BEACON ORANGE TIMEOUT ST (SAFETY) ×2 IMPLANT
FIBER LASER MOSES 200 DFL (Laser) IMPLANT
GLOVE BIOGEL M 7.0 STRL (GLOVE) ×2 IMPLANT
GOWN STRL REUS W/ TWL XL LVL3 (GOWN DISPOSABLE) ×1 IMPLANT
GOWN STRL REUS W/TWL XL LVL3 (GOWN DISPOSABLE) ×2
GUIDEWIRE STR DUAL SENSOR (WIRE) ×4 IMPLANT
GUIDEWIRE ZIPWRE .038 STRAIGHT (WIRE) IMPLANT
KIT TURNOVER KIT A (KITS) IMPLANT
LASER FIB FLEXIVA PULSE ID 365 (Laser) IMPLANT
MANIFOLD NEPTUNE II (INSTRUMENTS) ×2 IMPLANT
PACK CYSTO (CUSTOM PROCEDURE TRAY) ×2 IMPLANT
SHEATH NAVIGATOR HD 11/13X28 (SHEATH) ×1 IMPLANT
SHEATH URETERAL 12FR 45CM (SHEATH) IMPLANT
STENT URET 6FRX24 CONTOUR (STENTS) ×1 IMPLANT
TRACTIP FLEXIVA PULS ID 200XHI (Laser) IMPLANT
TRACTIP FLEXIVA PULSE ID 200 (Laser) ×1 IMPLANT
TUBING CONNECTING 10 (TUBING) ×2 IMPLANT
TUBING UROLOGY SET (TUBING) ×2 IMPLANT

## 2022-01-16 NOTE — Discharge Instructions (Addendum)
[facility] [phone] Post Ureteroscopy With or Without Stent Instructions  Definitions:  Ureter: The duct that transports urine from the kidney to the bladder. Stent:   A plastic hollow tube that is placed into the ureter, from the kidney to the bladder to prevent the ureter from swelling shut.  GENERAL INSTRUCTIONS:  Despite the fact that no skin incisions were used, the area around the ureter and bladder is raw and irritated. The stent is a foreign body which will further irritate the bladder wall. This irritation is manifested by increased frequency of urination, both day and night, and by an increase in the urge to urinate. In some, the urge to urinate is present almost always. Sometimes the urge is strong enough that you may not be able to stop yourself from urinating. The only real cure is to remove the stent and then give time for the bladder wall to heal which can't be done until the danger of the ureter swelling shut has passed, which varies.  You may see some blood in your urine while the stent is in place and a few days afterwards. Do not be alarmed, even if the urine was clear for a while. Get off your feet and drink lots of fluids until clearing occurs. If you start to pass clots or don't improve, call us.  DIET: You may return to your normal diet immediately. Because of the raw surface of your bladder, alcohol, spicy foods, acid type foods and drinks with caffeine may cause irritation or frequency and should be used in moderation. To keep your urine flowing freely and to avoid constipation, drink plenty of fluids during the day ( 8-10 glasses ). Tip: Avoid cranberry juice because it is very acidic.  ACTIVITY: Your physical activity doesn't need to be restricted. However, if you are very active, you may see some blood in your urine. We suggest that you reduce your activity under these circumstances until the bleeding has stopped.  BOWELS: It is important to  keep your bowels regular during the postoperative period. Straining with bowel movements can cause bleeding. A bowel movement every other day is reasonable. Use a mild laxative if needed, such as Milk of Magnesia 2-3 tablespoons, or 2 Dulcolax tablets. Call if you continue to have problems. If you have been taking narcotics for pain, before, during or after your surgery, you may be constipated. Take a laxative if necessary.   MEDICATION: You should resume your pre-surgery medications unless told not to. In addition you will often be given an antibiotic to prevent infection. These should be taken as prescribed until the bottles are finished unless you are having an unusual reaction to one of the drugs.  PROBLEMS YOU SHOULD REPORT TO [place]: Fevers over 100.5 Fahrenheit. Heavy bleeding, or clots ( See above notes about blood in urine ). Inability to urinate. Drug reactions ( hives, rash, nausea, vomiting, diarrhea ). Severe burning or pain with urination that is not improving.  FOLLOW-UP: You will need a follow-up appointment to monitor your progress. Call for this appointment at the number listed above. Usually the first appointment will be about three to fourteen days after your surgery.  He had a stent draining your left kidney.  This attached to a string.  Remove the stent by pulling on attached string on Thursday morning.

## 2022-01-16 NOTE — Op Note (Signed)
Operative Note  Preoperative diagnosis:  1.  Left ureteral and renal stone  Postoperative diagnosis: 1.  Left renal stones  Procedure(s): 1.  Cystoscopy 2. Left ureteroscopy with laser lithotripsy and basket extraction of stones 3. Left retrograde pyelogram 4. Left ureteral stent placement 5. Fluoroscopy with intraoperative interpretation  Surgeon: Jettie Pagan, MD  Assistants:  None  Anesthesia:  General  Complications:  None  EBL:  Minimal  Specimens: 1. Stones for stone analysis (to be done at [facility])  Drains/Catheters: 1.  6Fr x 24cm ureteral stent WITH a tether string  Intraoperative findings:   Cystoscopy demonstrated no suspicious bladder lesions. Left ureteroscopy demonstrated no evidence of ureteral stones.  There were 2 separate left lower pole stones each measuring about 5 mm. Left retrograde pyelogram with no hydronephrosis. Successful left ureteral stent placement.  Indication:  Charlene Dunn is a 61 y.o. female with history of a left proximal ureteral stone and left lower pole renal stones.  She presents today for definitive treatment of her stones.  Description of procedure: After informed consent was obtained from the patient, the patient was identified and taken to the operating room and placed in the supine position.  General anesthesia was administered as well as perioperative IV antibiotics.  At the beginning of the case, a time-out was performed to properly identify the patient, the surgery to be performed, and the surgical site.  Sequential compression devices were applied to the lower extremities at the beginning of the case for DVT prophylaxis.  The patient was then placed in the dorsal lithotomy supine position, prepped and draped in sterile fashion.  Preliminary scout fluoroscopy revealed that there was a 10mm calcification area at the left lower pole, which corresponds to the stone found on the preoperative CT scan. We then passed the  21-French rigid cystoscope through the urethra and into the bladder under vision without any difficulty, noting a normal urethra without strictures.  A systematic evaluation of the bladder revealed no evidence of any suspicious bladder lesions.  Ureteral orifices were in normal position.    Under cystoscopic and flouroscopic guidance, we cannulated the left ureteral orifice with a 5-French open-ended ureteral catheter and a gentle retrograde pyelogram was performed, revealing a normal caliber ureter without any filling defects. There was no hydronephrosis of the collecting system. A 0.038 sensor wire was then passed up to the level of the renal pelvis and secured to the drape as a safety wire. The ureteral catheter and cystoscope were removed, leaving the safety wire in place.   A semi-rigid ureteroscope was passed alongside the wire up the distal ureter which appeared normal. A second 0.038 sensor wire was passed under direct vision and the semirigid scope was removed.  The inner lumen and then subsequently a 11/13Fr ureteral access sheath was carefully advanced up the ureter to the level of the UPJ over this wire under fluoroscopic guidance. The flexible ureteroscope was advanced into the collecting system via the access sheath. The collecting system was inspected. The calculus was identified at the left lower pole.  We saw 2 separate stones each in the left lower pole each measuring about 5 mm.  Using a 0 tip basket, I repositioned the stones into an upper pole calyx.  Using the 200 micron holmium laser fiber, the stone was fragmented completely. A 2.2 Fr zero tip basket was used to remove the fragments under visual guidance. These were sent for chemical analysis. With the ureteroscope in the kidney, a gentle pyelogram  was performed to delineate the calyceal system and we evaluated the calyces systematically. We encountered a no further large stones. The rest of the stone fragments were very tiny and these  were  irrigated away gently. The calyces were re-inspected and there were no significant stone fragment residual.   We then withdrew the ureteroscope back down the ureter along with the access sheath, noting no evidence of any stones along the course of the ureter.  Prior to removing the ureteroscope, we did pass the Glidewire back up to the ureter to the renal pelvis.  Once the ureteroscope was removed, we then used the Glidewire under fluoroscopic guidance and passed up a 6-French x 24 cm double-pigtail ureteral stent up the ureter, making sure that the proximal and distal ends coiled within the kidney and bladder respectively.  Note that we left a long tether string attached to the distal end of the ureteral stent and it exited the urethral meatus and was secured to the inner thigh with a tegaderm adhesive.  The cystoscope was then advanced back into the bladder under vision.  We were able to see the distal stent coiling nicely within the bladder.  The bladder was then emptied with irrigation solution.  The cystoscope was then removed.    The patient tolerated the procedure well and there was no complication. Patient was awoken from anesthesia and taken to the recovery room in stable condition. I was present and scrubbed for the entirety of the case.  Plan:  Patient will be discharged home.  She may remove her stent on Thursday morning in 3 days.  Follow-up in 1 month with renal ultrasound.   Matt R. Josealberto Montalto MD [facility]  Pager: [phone]

## 2022-01-16 NOTE — Anesthesia Procedure Notes (Signed)
Procedure Name: LMA Insertion Date/Time: 01/16/2022 4:22 PM  Performed by: Sindy Guadeloupe, CRNAPre-anesthesia Checklist: Patient identified, Emergency Drugs available, Suction available, Patient being monitored and Timeout performed Patient Re-evaluated:Patient Re-evaluated prior to induction Oxygen Delivery Method: Circle system utilized Preoxygenation: Pre-oxygenation with 100% oxygen Induction Type: IV induction Ventilation: Mask ventilation without difficulty LMA: LMA inserted and LMA with gastric port inserted LMA Size: 4.0 Number of attempts: 1 Tube secured with: Tape Dental Injury: Teeth and Oropharynx as per pre-operative assessment

## 2022-01-16 NOTE — H&P (Signed)
Office Visit Report     01/10/2022   --------------------------------------------------------------------------------   Charlene Dunn  MRN: [id number]  DOB: 1961-05-01, 61 year old Female  SSN:    PRIMARY CARE:  [facility]  REFERRING:  Sherwood Gambler, MD  PROVIDER:  Rexene Alberts, M.D.  LOCATION:  [facility] [phone]     --------------------------------------------------------------------------------   CC/HPI: Charlene Dunn is a 61 year old female who is seen in consultation today for urolithiasis.   1. Urolithiasis:  -Pre 2023: She states she passed a stone around 2001.  -She presented the ED on 12/31/2021 with complaints of left-sided flank pain. CT A/P 12/31/2021 revealed a 3 mm proximal left ureteral stone and additional approximately 4 mm left lower pole stone. She had no right renal stones. There was mild proximal left hydronephrosis.  -She states that her pain has been managed well. She denies any significant pain today. She does have intermittent discomfort. She denies fevers, chills, nausea, emesis. She denies dysuria. Urinalysis is without sign of infection.  -KUB 01/10/2022 with 2 separate stones that both appear to be in the kidney. Did not see a stone in the ureter that I can accurately identify within the ureter.  -She is leaving for a trip to [place] in 2 weeks.     ALLERGIES: Codeine Sulfa    MEDICATIONS: Estradiol 2 mg tablet  Ibuprofen  Multivitamin  Oxycodone-Acetaminophen  Prilosec Otc 20 mg tablet, delayed release  Tylenol     GU PSH: Hysterectomy - 1995     NON-GU PSH: Remove Gallbladder - 2018     GU PMH: None   NON-GU PMH: GERD    FAMILY HISTORY: 1 Daughter - Runs in Family 1 son - Runs in Family Colon Cancer - Runs in Family Kidney Stones - Runs in Family melanoma - Runs in Family   SOCIAL HISTORY: Marital Status: Married Ethnicity: Not Hispanic Or Latino; Race: White Current Smoking Status: Patient has never  smoked.   Tobacco Use Assessment Completed: Used Tobacco in last 30 days? Does drink.  Drinks 1 caffeinated drink per day.    REVIEW OF SYSTEMS:    GU Review Female:   Patient reports frequent urination, hard to postpone urination, get up at night to urinate, and leakage of urine. Patient denies burning /pain with urination, stream starts and stops, trouble starting your stream, have to strain to urinate, and being pregnant.  Gastrointestinal (Upper):   Patient denies nausea, vomiting, and indigestion/ heartburn.  Gastrointestinal (Lower):   Patient denies diarrhea and constipation.  Constitutional:   Patient reports night sweats. Patient denies fever, weight loss, and fatigue.  Skin:   Patient denies skin rash/ lesion and itching.  Eyes:   Patient denies blurred vision and double vision.  Ears/ Nose/ Throat:   Patient denies sore throat and sinus problems.  Hematologic/Lymphatic:   Patient denies swollen glands and easy bruising.  Cardiovascular:   Patient reports leg swelling. Patient denies chest pains.  Respiratory:   Patient denies shortness of breath and cough.  Endocrine:   Patient denies excessive thirst.  Musculoskeletal:   Patient denies back pain and joint pain.  Neurological:   Patient denies headaches and dizziness.  Psychologic:   Patient denies depression and anxiety.   VITAL SIGNS:      01/10/2022 01:59 PM  Weight 225 lb / 102.06 kg  Height 62 in / 157.48 cm  BP 159/85 mmHg  Pulse 105 /min  BMI 41.1 kg/m   MULTI-SYSTEM PHYSICAL EXAMINATION:  Constitutional: Well-nourished. No physical deformities. Normally developed. Good grooming.  Respiratory: No labored breathing, no use of accessory muscles.   Cardiovascular: Normal temperature, normal extremity pulses, no swelling, no varicosities.  Gastrointestinal: No mass, no tenderness, no rigidity, non obese abdomen.     Complexity of Data:  Source Of History:  Patient, Medical Record Summary  Records Review:    Previous Doctor Records, Previous Patient Records  Urine Test Review:   Urinalysis  X-Ray Review: C.T. Abdomen/Pelvis: Reviewed Films. Reviewed Report. Discussed With Patient.    Notes:                     CLINICAL DATA: Left flank pain, history of kidney stones   EXAM:  CT ABDOMEN AND PELVIS WITHOUT CONTRAST   TECHNIQUE:  Multidetector CT imaging of the abdomen and pelvis was performed  following the standard protocol without IV contrast.   RADIATION DOSE REDUCTION: This exam was performed according to the  departmental dose-optimization program which includes automated  exposure control, adjustment of the mA and/or kV according to  patient size and/or use of iterative reconstruction technique.   COMPARISON: None Available.   FINDINGS:  Lower chest: No acute abnormality. Moderate hiatal hernia with  intrathoracic position of the gastric fundus.   Hepatobiliary: No focal liver abnormality is seen. Status post  cholecystectomy. No biliary dilatation.   Pancreas: Unremarkable. No pancreatic ductal dilatation or  surrounding inflammatory changes.   Spleen: Normal in size without significant abnormality.   Adrenals/Urinary Tract: Adrenal glands are unremarkable. There is a  0.3 cm calculus in the most proximal left ureter, near the  ureteropelvic junction, with mild associated left hydronephrosis.  Additional nonobstructive calculus of the inferior pole of the right  kidney. No right-sided calculi, right ureteral calculi, or  hydronephrosis. Bladder is unremarkable.   Stomach/Bowel: Stomach is within normal limits. Appendix appears  normal. No evidence of bowel wall thickening, distention, or  inflammatory changes.   Vascular/Lymphatic: No significant vascular findings are present. No  enlarged abdominal or pelvic lymph nodes.   Reproductive: Status post hysterectomy. Fluid attenuation cyst of  the right ovary measuring 4.0 cm.   Other: No abdominal wall hernia or  abnormality. No ascites.   Musculoskeletal: No acute or significant osseous findings.   IMPRESSION:  1. There is a 0.3 cm calculus in the most proximal left ureter, near  the ureteropelvic junction, with mild associated left  hydronephrosis.  2. Additional nonobstructive calculus of the inferior pole of the  right kidney. No right-sided calculi or right-sided hydronephrosis.  3. Moderate hiatal hernia.  4. Fluid attenuation cyst of the right ovary measuring 4.0 cm.  Although statistically likely to be benign, recommend follow-up US  in 6-12 months. Note: This recommendation does not apply to  premenarchal patients and to those with increased risk (genetic,  family history, elevated tumor markers or other high-risk factors)  of ovarian cancer. Reference: JACR 2020 Feb; 17(2):248-254    Electronically Signed  By: Delanna Ahmadi M.D.  On: 12/31/2021 17:35   PROCEDURES:         KUB - [id number]  A single view of the abdomen is obtained.      . Patient confirmed No Neulasta OnPro Device.           Urinalysis Dipstick Dipstick Cont'd  Color: Yellow Bilirubin: Neg mg/dL  Appearance: Clear Ketones: Neg mg/dL  Specific Gravity: <=1.005 Blood: Neg ery/uL  pH: 5.5 Protein: Neg mg/dL  Glucose: Neg mg/dL Urobilinogen: 0.2  mg/dL    Nitrites: Neg    Leukocyte Esterase: Neg leu/uL    ASSESSMENT:      ICD-10 Details  1 GU:   Renal and ureteral calculus - N20.2    PLAN:            Medications New Meds: Percocet 5 mg-325 mg tablet 1 tablet PO Q 4 H PRN   #18  0 Refill(s)  Pharmacy Name:  [facility] [phone]  Address:  [address]   [place], [id number]  Phone:  [phone]  Fax:  [phone]            Orders X-Rays: KUB. No Oral Contrast          Schedule         Document Letter(s):  Created for Patient: Clinical Summary         Notes:   #1. Left ureteral and left renal stones:  -CT A/P 12/29/2021 demonstrates a 3 mm proximal left ureteral  stone and 4 mm left lower pole stone. Unfortunately, we cannot reliably see a stone within her ureter today on KUB. We discussed options including medical expulsive therapy and ureteroscopy with laser lithotripsy. She elects proceed with laser lithotripsy. We will schedule for next Monday as she is going to [place] in 2 weeks. Discussed risk and benefits as below.   We discussed the options for management of kidney stones, including observation, ESWL, ureteroscopy with laser lithotripsy, and PCNL. The risks and benefits of each option were discussed.  For observation I described the risks which include but are not limited to silent renal damage, life-threatening infection, need for emergent surgery, failure to pass stone, and pain.   ESWL: risks and benefits of ESWL were outlined including infection, bleeding, pain, steinstrasse, kidney injury, need for ancillary treatments, and global anesthesia risks including but not limited to CVA, MI, DVT, PE, pneumonia, and death.   Ureteroscopy: risks and benefits of ureteroscopy were outlined, including infection, bleeding, pain, temporary ureteral stent and associated stent bother, ureteral injury, ureteral stricture, need for ancillary treatments, and global anesthesia risks including but not limited to CVA, MI, DVT, PE, pneumonia, and death.   PCNL: risks and benefits of PCNL were outlined including infection, bleeding, blood transfusion, pain, pneumothorax, bowel injury, persistent urine leak, positioning injury, inability to clear stone burden, renal laceration, arterial venous fistula or malformation, need for ancillary treatments, and global anesthesia risks including but not limited to CVA, MI, DVT, PE, pneumonia, and death.    We discussed dietary methods for stone prevention including the following: increased water intake to 2-3 liters per day, add lemon or lemon concentrate to water to increase citrate which is beneficial for stone prevention, limiting  dietary sodium to less than 2000 mg per day, limiting animal protein to less than 2 servings (16 ounces/day), and limiting foods high in oxalate content (spinach, beans, chocolate, etc.).   CC: Sherwood Gambler, MD    Signed by Rexene Alberts, M.D. on 01/10/22 at 3:10 PM (EDT)  Urology Preoperative H&P   Chief Complaint: Left ureteral and renal stone  History of Present Illness: Charlene Dunn is a 61 y.o. female with left ureteral and renal stone here for cysto, L URS/LL, L RPG, L stent.  Past Medical History:  Diagnosis Date   Cholecystitis    GERD (gastroesophageal reflux disease)    Headache    History of kidney stones    PONV (postoperative nausea and vomiting)     Past Surgical  History:  Procedure Laterality Date   ABDOMINAL HYSTERECTOMY  1995   BREAST BIOPSY Right 12/2014   benign   CHOLECYSTECTOMY N/A 09/25/2016   Procedure: LAPAROSCOPIC CHOLECYSTECTOMY;  Surgeon: Clovis Riley, MD;  Location: [facility];  Service: General;  Laterality: N/A;   COLONOSCOPY  2023   x2   TONSILLECTOMY     as a child   TUBAL LIGATION  1994    Allergies:  Allergies  Allergen Reactions   Cinnamon Swelling    Swelling of tongue Inside of mouth turns white   Codeine Nausea And Vomiting   Sulfa Antibiotics Nausea And Vomiting and Other (See Comments)    Headaches   Yellow Jacket Venom Swelling    Tingling, swelling in the area that was stung    Family History  Problem Relation Age of Onset   Breast cancer Paternal Grandmother        unsure of age    Social History:  reports that she has never smoked. She has never used smokeless tobacco. She reports current alcohol use. She reports that she does not use drugs.  ROS: A complete review of systems was performed.  All systems are negative except for pertinent findings as noted.  Physical Exam:  Vital signs in last 24 hours: Temp:  [98.2 F (36.8 C)] 98.2 F (36.8 C) (06/12 1426) Pulse Rate:  [104] 104 (06/12 1426) Resp:  [16] 16  (06/12 1426) BP: (174)/(90) 174/90 (06/12 1426) SpO2:  [97 %] 97 % (06/12 1426) Weight:  [102.1 kg] 102.1 kg (06/12 1436) Constitutional:  Alert and oriented, No acute distress Cardiovascular: Regular rate and rhythm Respiratory: Normal respiratory effort, Lungs clear bilaterally GI: Abdomen is soft, nontender, nondistended, no abdominal masses GU: No CVA tenderness Lymphatic: No lymphadenopathy Neurologic: Grossly intact, no focal deficits Psychiatric: Normal mood and affect  Laboratory Data:  No results for input(s): "WBC", "HGB", "HCT", "PLT" in the last 72 hours.  No results for input(s): "NA", "K", "CL", "GLUCOSE", "BUN", "CALCIUM", "CREATININE" in the last 72 hours.  Invalid input(s): "CO3"   No results found for this or any previous visit (from the past 24 hour(s)). No results found for this or any previous visit (from the past 240 hour(s)).  Renal Function: Recent Labs    01/12/22 1423  CREATININE 0.62   Estimated Creatinine Clearance: 84.7 mL/min (by C-G formula based on SCr of 0.62 mg/dL).  Radiologic Imaging: No results found.  I independently reviewed the above imaging studies.  Assessment and Plan EMMA-LOUISE KIHN is a 61 y.o. female with left ureteral and renal stone here for cysto, L URS/LL, L RPG, L stent.  -The risks, benefits and alternatives of cystoscopy with L URS/LL/L RPG, L JJ stent placement was discussed with the patient.  Risks include, but are not limited to: bleeding, urinary tract infection, ureteral injury, ureteral stricture disease, chronic pain, urinary symptoms, bladder injury, stent migration, the need for nephrostomy tube placement, MI, CVA, DVT, PE and the inherent risks with general anesthesia.  The patient voices understanding and wishes to proceed.      Matt R. Kaison Mcparland MD 01/16/2022, 3:40 PM  [facility] Pager: [phone]): [phone]

## 2022-01-16 NOTE — Transfer of Care (Signed)
Immediate Anesthesia Transfer of Care Note  Patient: Charlene Dunn  Procedure(s) Performed: CYSTOSCOPY/RETROGRADE/URETEROSCOPY/HOLMIUM LASER/STENT PLACEMENT (Left)  Patient Location: PACU  Anesthesia Type:General  Level of Consciousness: awake, alert  and patient cooperative  Airway & Oxygen Therapy: Patient Spontanous Breathing and Patient connected to face mask oxygen  Post-op Assessment: Report given to RN and Post -op Vital signs reviewed and stable  Post vital signs: Reviewed and stable  Last Vitals:  Vitals Value Taken Time  BP 159/87 01/16/22 1751  Temp    Pulse 92 01/16/22 1752  Resp    SpO2 100 % 01/16/22 1752  Vitals shown include unvalidated device data.  Last Pain:  Vitals:   01/16/22 1436  TempSrc:   PainSc: 0-No pain         Complications: No notable events documented.

## 2022-01-16 NOTE — Anesthesia Postprocedure Evaluation (Signed)
Anesthesia Post Note  Patient: AVIONNA BOWER  Procedure(s) Performed: CYSTOSCOPY/RETROGRADE/URETEROSCOPY/HOLMIUM LASER/STENT PLACEMENT (Left)     Patient location during evaluation: PACU Anesthesia Type: General Level of consciousness: awake and alert Pain management: pain level controlled Vital Signs Assessment: post-procedure vital signs reviewed and stable Respiratory status: spontaneous breathing, nonlabored ventilation, respiratory function stable and patient connected to nasal cannula oxygen Cardiovascular status: blood pressure returned to baseline and stable Postop Assessment: no apparent nausea or vomiting Anesthetic complications: no   No notable events documented.  Last Vitals:  Vitals:   01/16/22 1815 01/16/22 1846  BP: (!) 158/85 (!) 158/85  Pulse: 74 68  Resp: 14 20  Temp: 36.7 C 36.5 C  SpO2: 94% 97%    Last Pain:  Vitals:   01/16/22 1846  TempSrc: Oral  PainSc: 0-No pain                 Trevor Iha

## 2022-01-17 ENCOUNTER — Encounter (HOSPITAL_COMMUNITY): Payer: Self-pay | Admitting: Urology

## 2022-04-06 ENCOUNTER — Other Ambulatory Visit: Payer: Self-pay | Admitting: Obstetrics & Gynecology

## 2022-04-06 DIAGNOSIS — Z1231 Encounter for screening mammogram for malignant neoplasm of breast: Secondary | ICD-10-CM

## 2022-04-13 DIAGNOSIS — K219 Gastro-esophageal reflux disease without esophagitis: Secondary | ICD-10-CM | POA: Insufficient documentation

## 2022-05-04 ENCOUNTER — Ambulatory Visit
Admission: RE | Admit: 2022-05-04 | Discharge: 2022-05-04 | Disposition: A | Discharge status: Home / Self Care | Payer: BC Managed Care – PPO | Source: Ambulatory Visit | Attending: Obstetrics & Gynecology | Admitting: Obstetrics & Gynecology

## 2022-05-04 DIAGNOSIS — Z1231 Encounter for screening mammogram for malignant neoplasm of breast: Secondary | ICD-10-CM

## 2023-01-10 IMAGING — MG MM DIGITAL SCREENING BILAT W/ TOMO AND CAD
8 series · 8 of 24 positions shown · non-contrast
Comparison: Previous exam(s).

CLINICAL DATA: Screening.

EXAM:
DIGITAL SCREENING BILATERAL MAMMOGRAM WITH TOMOSYNTHESIS AND CAD
TECHNIQUE: Bilateral screening digital craniocaudal and mediolateral oblique
mammograms were obtained. Bilateral screening digital breast
tomosynthesis was performed. The images were evaluated with
computer-aided detection.

[R MLO synth-2D]
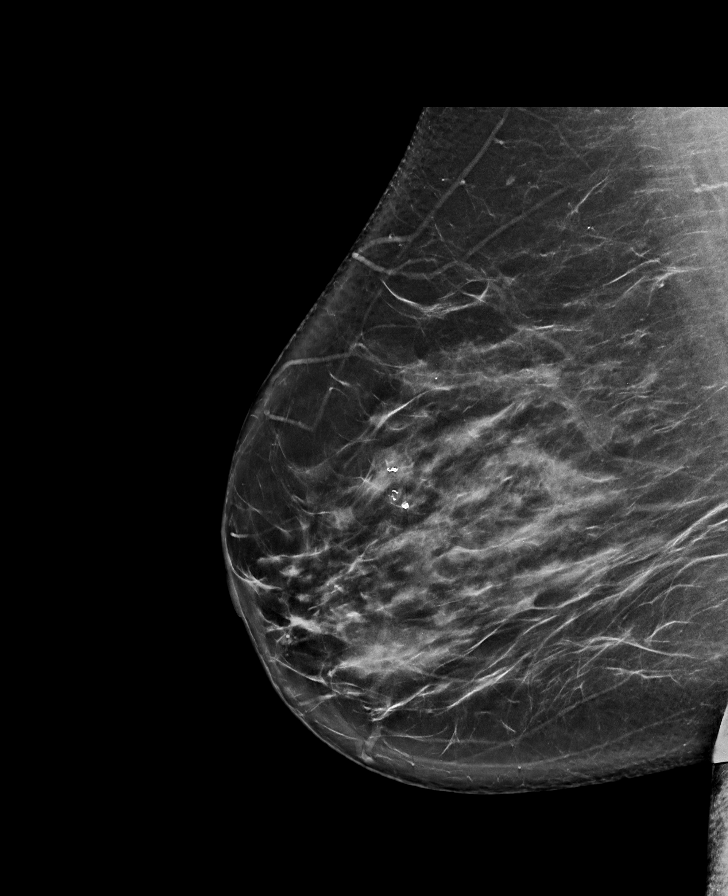

[R CC synth-2D]
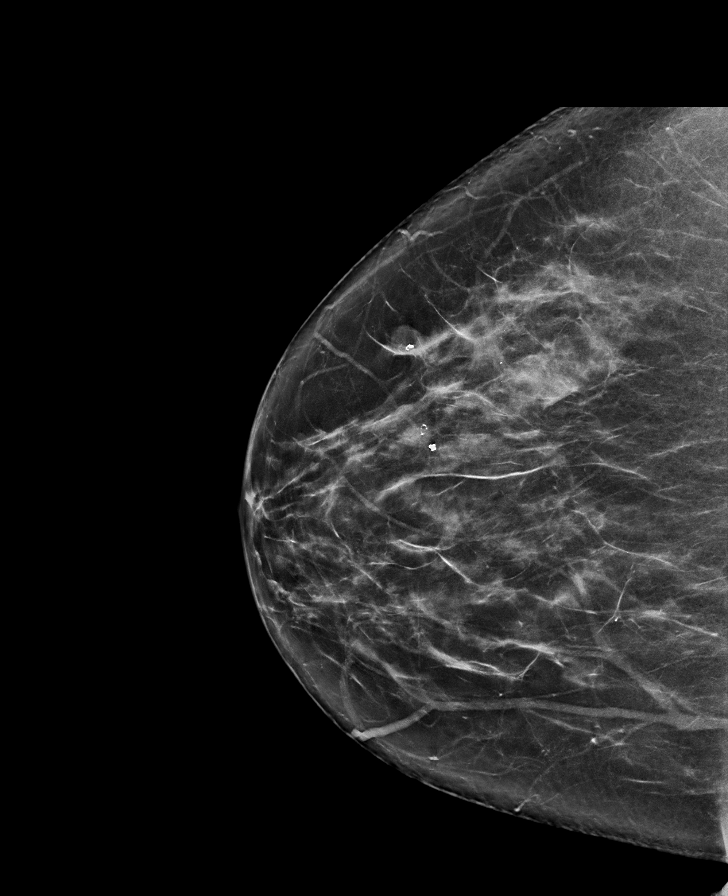

[L MLO synth-2D]
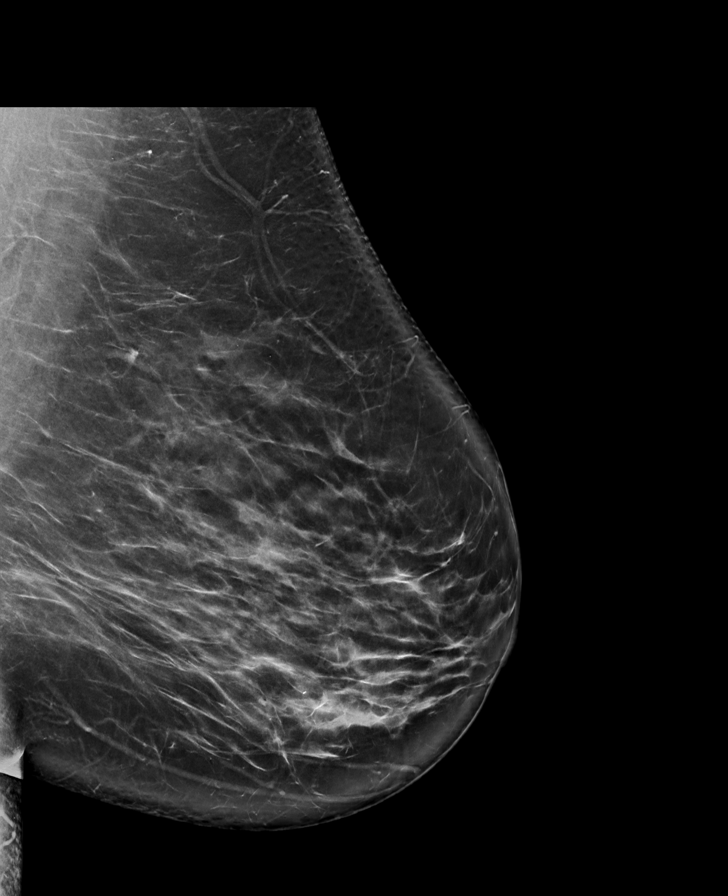

[L CC synth-2D]
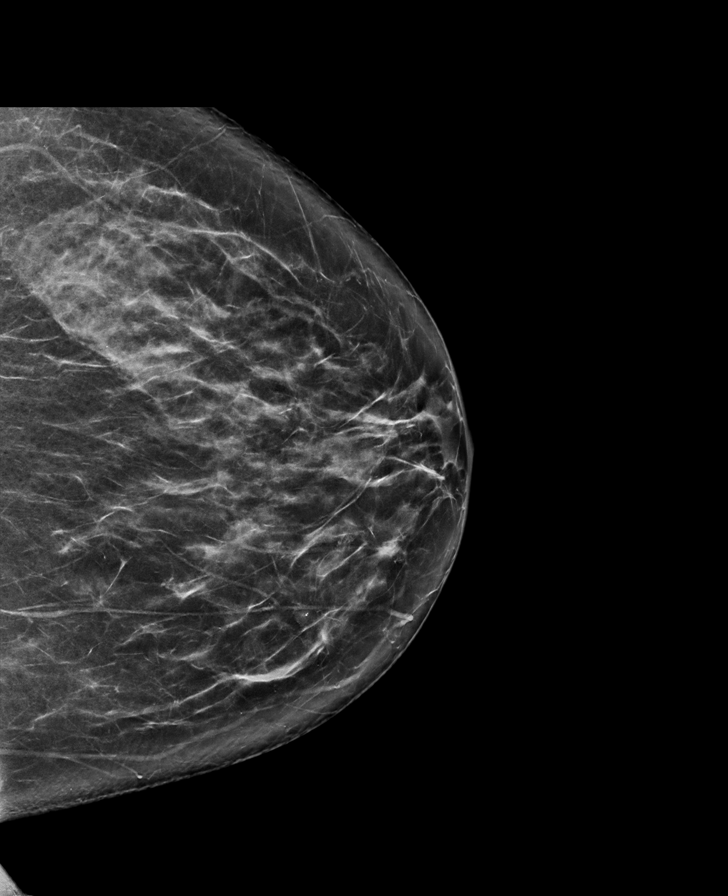

[R MLO tomo · tomo slice 45/90.0]
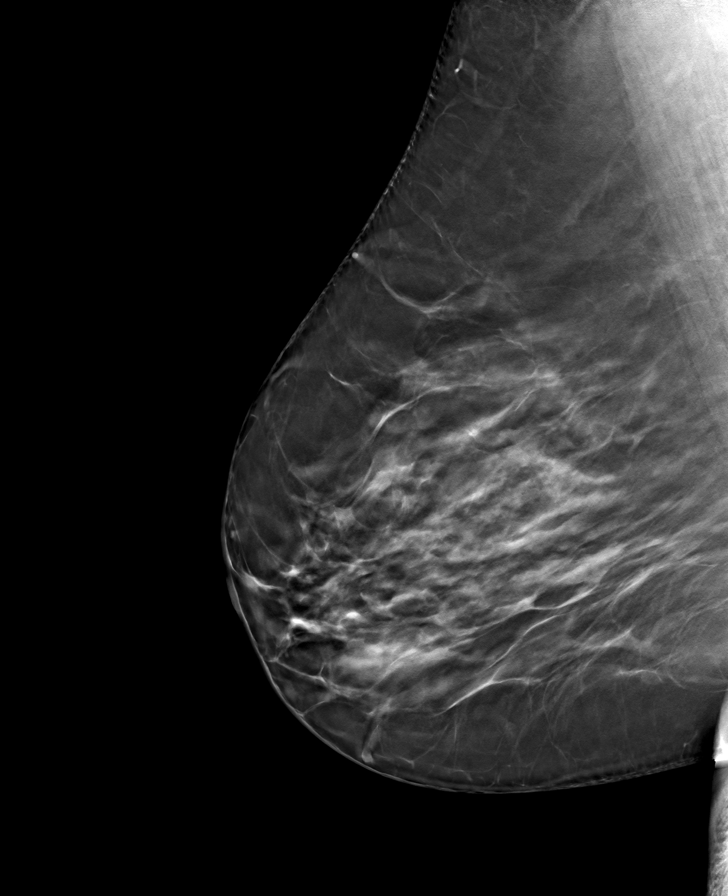

[L CC tomo · tomo slice 41/81.0]
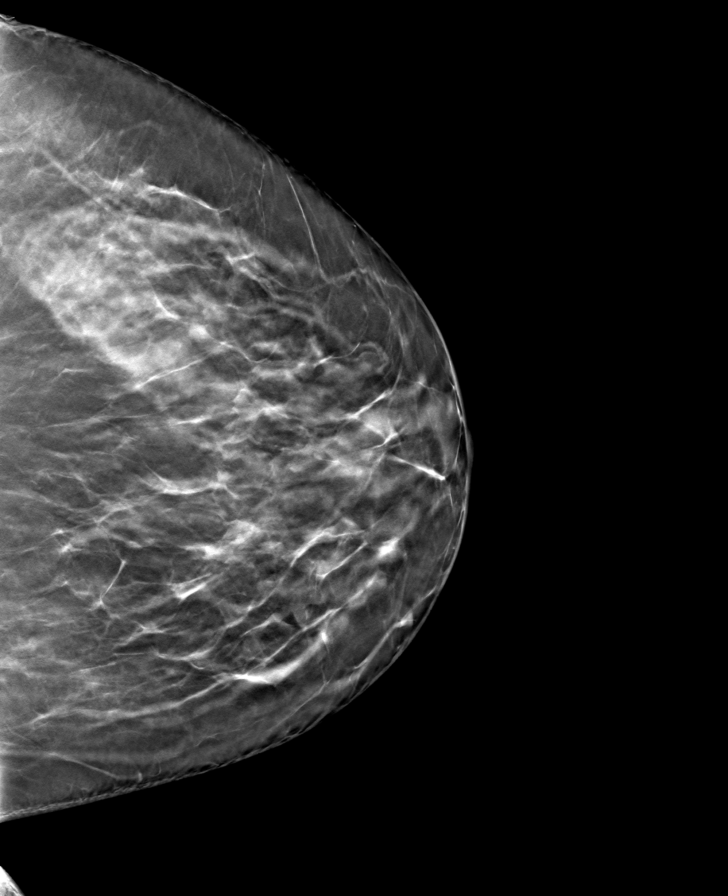

[L MLO tomo · tomo slice 49/96.0]
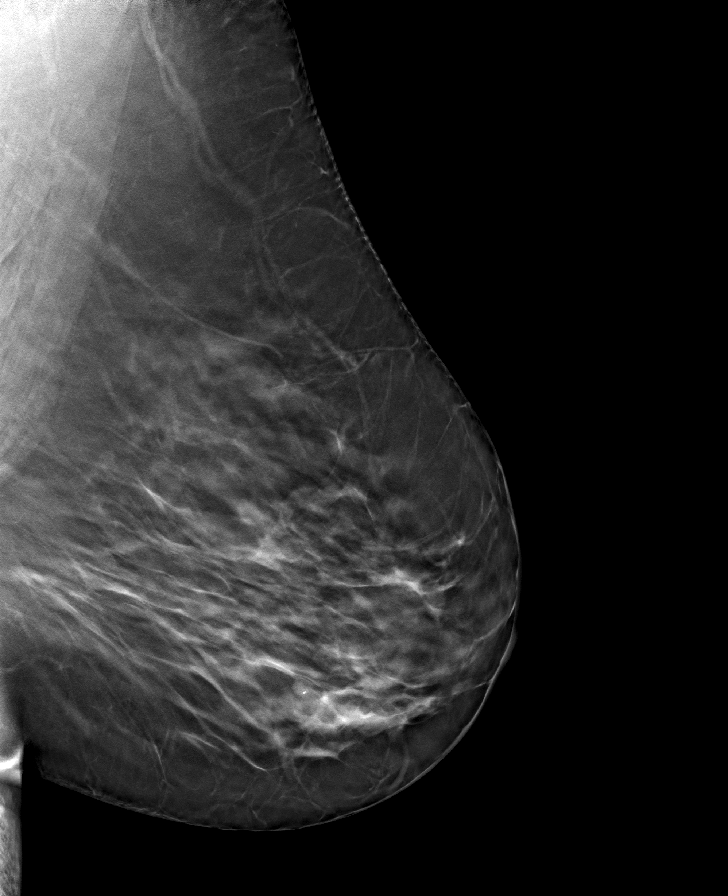

[R CC tomo · tomo slice 42/83.0]
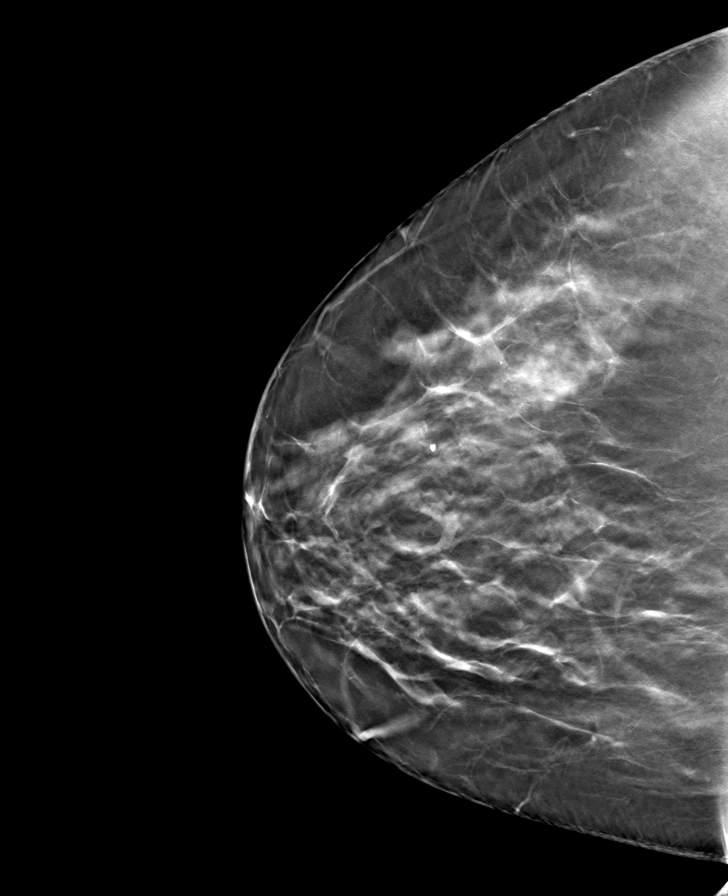

[8 of 24 positions shown; findings below may reference images not displayed]

ACR Breast Density Category c: The breast tissue is heterogeneously
dense, which may obscure small masses.
FINDINGS: There are no findings suspicious for malignancy.
IMPRESSION: No mammographic evidence of malignancy. A result letter of this
screening mammogram will be mailed directly to the patient.

RECOMMENDATION:
Screening mammogram in one year. (Code:[id number])

BI-RADS CATEGORY  1: Negative.

## 2023-03-23 ENCOUNTER — Other Ambulatory Visit: Payer: Self-pay | Admitting: Obstetrics & Gynecology

## 2023-03-23 DIAGNOSIS — Z1231 Encounter for screening mammogram for malignant neoplasm of breast: Secondary | ICD-10-CM

## 2023-05-07 ENCOUNTER — Ambulatory Visit: Payer: BC Managed Care – PPO

## 2023-05-30 ENCOUNTER — Ambulatory Visit
Admission: RE | Admit: 2023-05-30 | Discharge: 2023-05-30 | Disposition: A | Discharge status: Home / Self Care | Payer: BC Managed Care – PPO | Source: Ambulatory Visit | Attending: Obstetrics & Gynecology | Admitting: Obstetrics & Gynecology

## 2023-05-30 DIAGNOSIS — Z1231 Encounter for screening mammogram for malignant neoplasm of breast: Secondary | ICD-10-CM

## 2023-06-20 ENCOUNTER — Ambulatory Visit (INDEPENDENT_AMBULATORY_CARE_PROVIDER_SITE_OTHER): Payer: BC Managed Care – PPO | Admitting: Obstetrics and Gynecology

## 2023-06-20 ENCOUNTER — Encounter: Payer: Self-pay | Admitting: Obstetrics and Gynecology

## 2023-06-20 VITALS — BP 138/84 | HR 87 | Wt 225.0 lb

## 2023-06-20 DIAGNOSIS — N393 Stress incontinence (female) (male): Secondary | ICD-10-CM

## 2023-06-20 DIAGNOSIS — N3281 Overactive bladder: Secondary | ICD-10-CM

## 2023-06-20 DIAGNOSIS — R350 Frequency of micturition: Secondary | ICD-10-CM | POA: Diagnosis not present

## 2023-06-20 LAB — POCT URINALYSIS DIPSTICK
Bilirubin, UA: NEGATIVE
Blood, UA: NEGATIVE
Glucose, UA: NEGATIVE
Ketones, UA: NEGATIVE
Leukocytes, UA: NEGATIVE
Nitrite, UA: NEGATIVE
Protein, UA: NEGATIVE
Spec Grav, UA: 1.025 (ref 1.010–1.025)
Urobilinogen, UA: 0.2 U/dL
pH, UA: 5 (ref 5.0–8.0)

## 2023-06-20 MED ORDER — MIRABEGRON ER 25 MG PO TB24: 25.0000 mg | ORAL_TABLET | Freq: Every day | ORAL | 5 refills | Status: DC

## 2023-06-20 NOTE — Patient Instructions (Signed)
Today we talked about ways to manage bladder urgency such as altering your diet to avoid irritative beverages and foods (bladder diet) as well as attempting to decrease stress and other exacerbating factors.   The Most Bothersome Foods* The Least Bothersome Foods*  Coffee - Regular & Decaf Tea - caffeinated Carbonated beverages - cola, non-colas, diet & caffeine-free Alcohols - Beer, Red Wine, White Wine, Champagne Fruits - Grapefruit, Lemon, Orange, Pineapple Fruit Juices - Cranberry, Grapefruit, Orange, Pineapple Vegetables - Tomato & Tomato Products Flavor Enhancers - Hot peppers, Spicy foods, Chili, Horseradish, Vinegar, Monosodium glutamate (MSG) Artificial Sweeteners - NutraSweet, Sweet 'N Low, Equal (sweetener), Saccharin Ethnic foods - Mexican, Thai, Indian food Water Milk - low-fat & whole Fruits - Bananas, Blueberries, Honeydew melon, Pears, Raisins, Watermelon Vegetables - Broccoli, Brussels Sprouts, Cabbage, Carrots, Cauliflower, Celery, Cucumber, Mushrooms, Peas, Radishes, Squash, Zucchini, White potatoes, Sweet potatoes & yams Poultry - Chicken, Eggs, Turkey, Meat - Beef, Pork, Lamb Seafood - Shrimp, Tuna fish, Salmon Grains - Oat, Rice Snacks - Pretzels, Popcorn  *Friedlander J. et al. Diet and its role in interstitial cystitis/bladder pain syndrome (IC/BPS) and comorbid conditions. [employer]. [employer] 2012 Jan 11.    

## 2023-06-20 NOTE — Progress Notes (Signed)
New Patient Evaluation and Consultation  Referring Provider: Mitchel Honour, DO PCP: Juluis Rainier, MD (Inactive) Date of Service: 06/20/2023  SUBJECTIVE Chief Complaint: New Patient (Initial Visit) Charlene Dunn is a 62 y.o. female here for a consult for incontinence. Pt said she also has urgency.)  History of Present Illness: MONETTE CHAMU is a 62 y.o. White or Caucasian female seen in consultation at the request of Dr Langston Masker for evaluation of incontinence.    Urinary Symptoms: Leaks urine with cough/ sneeze, exercise, going from sitting to standing, with a full bladder, with movement to the bathroom, without sensation, and while asleep Leaks 8+ time(s) per day. Urgency > SUI Pad use: 3- 5 pads per day.   Patient is bothered by UI symptoms. Leakage has been worsening over the last year and a half since she had a kidney stone removed.  Has tried pelvic floor exercises at home but has not seen improvement.   Day time voids 10+.  Nocturia: 3-4 times per night to void. Voiding dysfunction:  empties bladder well.  Patient does not use a catheter to empty bladder.  When urinating, patient feels dribbling after finishing Drinks: 16oz pepsi around lunch, milk, 16oz water (sometimes the crystal light) or can of sprite. Has cut back from 2-3 sodas per day. Has stopped drinking coffee.   UTIs:  0  UTI's in the last year.   Reports history of kidney or bladder stones. No recent symptoms.    Pelvic Organ Prolapse Symptoms:                  Patient Denies a feeling of a bulge the vaginal area.   Bowel Symptom: Bowel movements: 1 time(s) per day Stool consistency: hard Straining: no.  Splinting: no.  Incomplete evacuation: no.  Patient Denies accidental bowel leakage / fecal incontinence Bowel regimen: stool softener Last colonoscopy: Date 2023- negative   Sexual Function Sexually active: yes.  Sexual orientation:  heterosexual Pain with sex: No  Pelvic Pain Denies pelvic  pain   Past Medical History:  Past Medical History:  Diagnosis Date   Cholecystitis    GERD (gastroesophageal reflux disease)    Headache    History of kidney stones    PONV (postoperative nausea and vomiting)      Past Surgical History:   Past Surgical History:  Procedure Laterality Date   BREAST BIOPSY Right 12/2014   benign   CHOLECYSTECTOMY N/A 09/25/2016   Procedure: LAPAROSCOPIC CHOLECYSTECTOMY;  Surgeon: Berna Bue, MD;  Location: [facility];  Service: General;  Laterality: N/A;   COLONOSCOPY  2023   x2   CYSTOSCOPY/URETEROSCOPY/HOLMIUM LASER/STENT PLACEMENT Left 01/16/2022   Procedure: CYSTOSCOPY/RETROGRADE/URETEROSCOPY/HOLMIUM LASER/STENT PLACEMENT;  Surgeon: Jannifer Hick, MD;  Location: [facility];  Service: Urology;  Laterality: Left;  ONLY NEEDS 60 MIN   scar tissue removal     vaginal   TONSILLECTOMY     as a child   TUBAL LIGATION  1994   VAGINAL HYSTERECTOMY  1995     Past OB/GYN History: OB History  Gravida Para Term Preterm AB Living  2 2 2     2   SAB IAB Ectopic Multiple Live Births          2    # Outcome Date GA Lbr Len/2nd Weight Sex Type Anes PTL Lv  2 Term      Vag-Vacuum     1 Term      Vag-Spont      S/p hysterectomy  Medications: Patient has a current medication list which includes the following prescription(s): aspirin-salicylamide-caffeine, calcium carbonate, calcium carbonate, cyanocobalamin, docusate sodium, epinephrine, estradiol, ibuprofen, multivitamin with minerals, nystatin-triamcinolone ointment, omeprazole, and mirabegron er.   Allergies: Patient is allergic to cinnamon, codeine, sulfa antibiotics, and yellow jacket venom.   Social History:  Social History   Tobacco Use   Smoking status: Never   Smokeless tobacco: Never  Vaping Use   Vaping status: Never Used  Substance Use Topics   Alcohol use: Yes    Comment: socailly   Drug use: No    Relationship status: married Patient lives with husband.   Patient is not  employed. Regular exercise: No History of abuse: No  Family History:   Family History  Problem Relation Age of Onset   Melanoma Mother    Colon cancer Father    Breast cancer Paternal Grandmother        unsure of age     Review of Systems: Review of Systems  Constitutional:  Negative for fever, malaise/fatigue and weight loss.  Respiratory:  Negative for cough, shortness of breath and wheezing.   Cardiovascular:  Negative for chest pain, palpitations and leg swelling.  Gastrointestinal:  Negative for abdominal pain and blood in stool.  Genitourinary:  Negative for dysuria.  Musculoskeletal:  Negative for myalgias.  Skin:  Negative for rash.  Neurological:  Positive for headaches. Negative for dizziness.  Endo/Heme/Allergies:  Does not bruise/bleed easily.       + hot flashes  Psychiatric/Behavioral:  Negative for depression. The patient is not nervous/anxious.      OBJECTIVE Physical Exam: Vitals:   06/20/23 1045  BP: 138/84  Pulse: 87  Weight: 225 lb (102.1 kg)    Physical Exam Constitutional:      General: She is not in acute distress. Pulmonary:     Effort: Pulmonary effort is normal.  Abdominal:     General: There is no distension.     Palpations: Abdomen is soft.     Tenderness: There is no abdominal tenderness. There is no rebound.  Musculoskeletal:        General: No swelling. Normal range of motion.  Skin:    General: Skin is warm and dry.     Findings: No rash.  Neurological:     Mental Status: She is alert and oriented to person, place, and time.  Psychiatric:        Mood and Affect: Mood normal.        Behavior: Behavior normal.      GU / Detailed Urogynecologic Evaluation:  Pelvic Exam: Normal external female genitalia; Bartholin's and Skene's glands normal in appearance; urethral meatus normal in appearance, no urethral masses or discharge.   CST: negative   s/p hysterectomy: Speculum exam reveals normal vaginal mucosa with  atrophy and  normal vaginal cuff.  Adnexa no mass, fullness, tenderness.     Pelvic floor strength I/V  Pelvic floor musculature: Right levator non-tender, Right obturator non-tender, Left levator non-tender, Left obturator non-tender  POP-Q:   POP-Q  -3                                            Aa   -3  Ba  -8.5                                              C   4                                            Gh  6                                            Pb  9                                            tvl   -2                                            Ap  -2                                            Bp                                                 D      Rectal Exam:  Normal external rectum  Post-Void Residual (PVR) by Bladder Scan: In order to evaluate bladder emptying, we discussed obtaining a postvoid residual and patient agreed to this procedure.  Procedure: The ultrasound unit was placed on the patient's abdomen in the suprapubic region after the patient had voided.    Post Void Residual - 06/20/23 1056       Post Void Residual   Post Void Residual 2 mL              Laboratory Results: Lab Results  Component Value Date   COLORU Yellow 06/20/2023   CLARITYU Clear 06/20/2023   GLUCOSEUR Negative 06/20/2023   BILIRUBINUR Negative 06/20/2023   KETONESU Negative 06/20/2023   SPECGRAV 1.025 06/20/2023   RBCUR Negative 06/20/2023   PHUR 5.0 06/20/2023   PROTEINUR Negative 06/20/2023   UROBILINOGEN 0.2 06/20/2023   LEUKOCYTESUR Negative 06/20/2023    Lab Results  Component Value Date   CREATININE 0.62 01/12/2022   CREATININE 0.68 12/31/2021    No results found for: "HGBA1C"  Lab Results  Component Value Date   HGB 10.3 (L) 01/12/2022     ASSESSMENT AND PLAN Ms. Madriaga is a 62 y.o. with:  1. Overactive bladder   2. Urinary frequency   3. SUI (stress urinary incontinence, female)    OAB - We  discussed the symptoms of overactive bladder (OAB), which include urinary urgency, urinary frequency, nocturia, with or without urge incontinence.  While we do not know the exact etiology of OAB, several treatment options exist. We  discussed management including behavioral therapy (decreasing bladder irritants, urge suppression strategies, timed voids, bladder retraining), physical therapy, medication; for refractory cases posterior tibial nerve stimulation, sacral neuromodulation, and intravesical botulinum toxin injection.  - Prescribed myrbetriq 25mg  daily. For Beta-3 agonist medication, we discussed the potential side effect of elevated blood pressure which is more likely to occur in individuals with uncontrolled hypertension. - avoid bladder irritants such as soda and drink more water  2. SUI - For treatment of stress urinary incontinence,  non-surgical options include expectant management, weight loss, physical therapy, as well as a pessary.  Surgical options include a midurethral sling, Burch urethropexy, and transurethral injection of a bulking agent. - She does not feel SUI is occurring enough for treatment at this time. Will focus on OAB  Return 6 weeks   Marguerita Beards, MD

## 2023-08-06 ENCOUNTER — Ambulatory Visit: Payer: BC Managed Care – PPO | Admitting: Obstetrics and Gynecology

## 2023-08-10 ENCOUNTER — Ambulatory Visit (INDEPENDENT_AMBULATORY_CARE_PROVIDER_SITE_OTHER): Payer: 59 | Admitting: Obstetrics and Gynecology

## 2023-08-10 ENCOUNTER — Encounter: Payer: Self-pay | Admitting: Obstetrics and Gynecology

## 2023-08-10 ENCOUNTER — Telehealth: Payer: Self-pay

## 2023-08-10 VITALS — BP 130/76 | HR 83

## 2023-08-10 DIAGNOSIS — R350 Frequency of micturition: Secondary | ICD-10-CM | POA: Diagnosis not present

## 2023-08-10 DIAGNOSIS — N3281 Overactive bladder: Secondary | ICD-10-CM | POA: Diagnosis not present

## 2023-08-10 MED ORDER — MIRABEGRON ER 50 MG PO TB24: 50.0000 mg | ORAL_TABLET | Freq: Every day | ORAL | 3 refills | Status: DC

## 2023-08-10 MED ORDER — MIRABEGRON ER 50 MG PO TB24: 50.0000 mg | ORAL_TABLET | Freq: Every day | ORAL | 12 refills | Status: DC

## 2023-08-10 NOTE — Patient Instructions (Signed)
 You can double the 25mg  until you run out of the 25mg   Start the 50mg  when you run out.   Keep track how often you are getting up at night and how significant the urgency is during the day.

## 2023-08-10 NOTE — Telephone Encounter (Signed)
 Pharmacy changed to [facility] and a 90 day supply sent in

## 2023-08-10 NOTE — Addendum Note (Signed)
 Addended by: Selmer Dominion on: 08/10/2023 10:58 AM   Modules accepted: Orders

## 2023-08-10 NOTE — Progress Notes (Addendum)
  Urogynecology Return Visit  SUBJECTIVE  History of Present Illness: Charlene Dunn is a 63 y.o. female seen in follow-up for OAB. Plan at last visit was start Myrbetriq  25mg  daily.  Patient reports about a 30% improvement in urgency/frequency. She reports she has decreased her pad use to 2-3 a day instead of 4-5 which has been nice. She reports her nocturia remains variable as some nights she is up multiple times and other nights she has slept through the night only to wake up and have significantly high amounts of urgency.      Past Medical History: Patient  has a past medical history of Cholecystitis, GERD (gastroesophageal reflux disease), Headache, History of kidney stones, and PONV (postoperative nausea and vomiting).   Past Surgical History: She  has a past surgical history that includes Vaginal hysterectomy (1995); Tubal ligation (1994); Tonsillectomy; Colonoscopy (2023); Cholecystectomy (N/A, 09/25/2016); Breast biopsy (Right, 12/2014); Cystoscopy/ureteroscopy/holmium laser/stent placement (Left, 01/16/2022); and scar tissue removal.   Medications: She has a current medication list which includes the following prescription(s): mirabegron  er, aspirin-salicylamide-caffeine, calcium carbonate, calcium carbonate, cyanocobalamin, docusate sodium , epinephrine, estradiol , ibuprofen, multivitamin with minerals, nystatin-triamcinolone ointment, and omeprazole.   Allergies: Patient is allergic to bee venom, cinnamon, codeine, sulfa antibiotics, and yellow jacket venom.   Social History: Patient  reports that she has never smoked. She has never used smokeless tobacco. She reports current alcohol use. She reports that she does not use drugs.     OBJECTIVE     Physical Exam: Vitals:   08/10/23 0841  BP: 130/76  Pulse: 83   Gen: No apparent distress, A&O x 3.  Detailed Urogynecologic Evaluation:  Deferred.    ASSESSMENT AND PLAN    Ms. Budreau is a 63 y.o. with:  1.  Overactive bladder   2. Urinary frequency     Patient has had good benefit from Myrbetriq  but would like more improvement we will go up on the medication dosage.  Myrbetriq  increased to 50 mg daily.  Patient to double her 25 mg to make 50 mg until she runs out. We discussed SUI as well and she reports that this is still not as bothersome to her the goal is to get her UUI and nocturia under control.  Patient to follow-up in 6 months for medication check or sooner if needed.  I discussed with her that if the medication is not working well for her or she feels she needs another appointment that she can call the office we will get her in.   Ranard Harte G Jahsir Rama, NP

## 2023-08-10 NOTE — Telephone Encounter (Signed)
 Pt requesting her new myrbetriq rx be sent [facility].

## 2023-09-09 IMAGING — CT CT RENAL STONE PROTOCOL
2 of 4 series · 16 of 46 positions shown, 18 images · non-contrast
Comparison: None Available.

CLINICAL DATA: Left flank pain, history of kidney stones



[Series 2: stone full · axial · 0.87mm/px · z∈[-181,+234]mm · 13 of 91 slices shown, 15 images]
[im 4/91  soft-tissue]
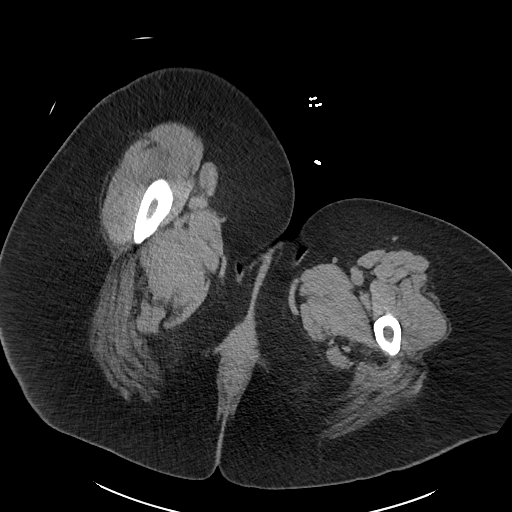
[im 4/91  bone]
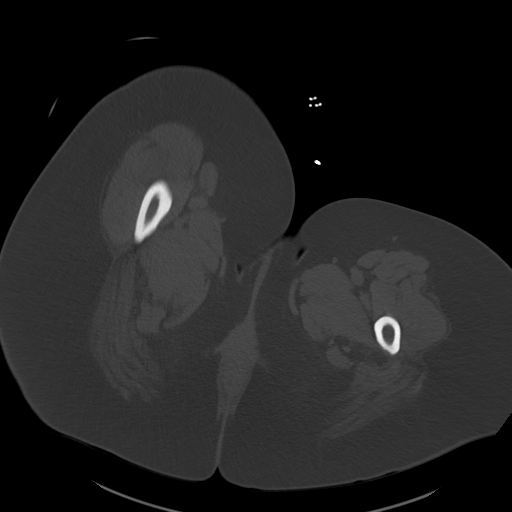
[im 11/91  soft-tissue]
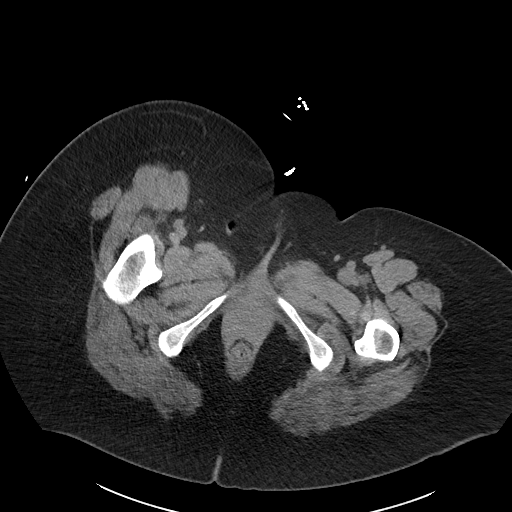
[im 18/91  soft-tissue]
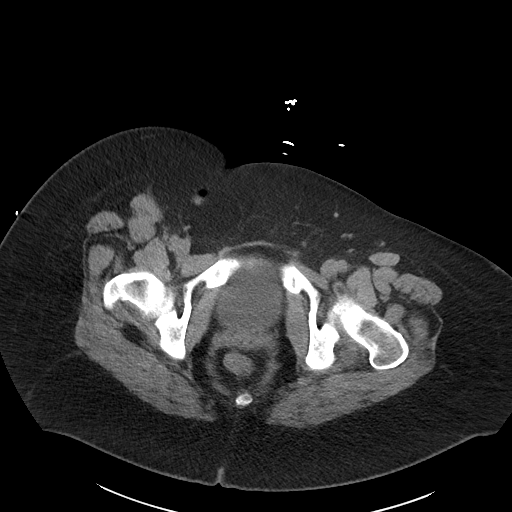
[im 25/91  soft-tissue]
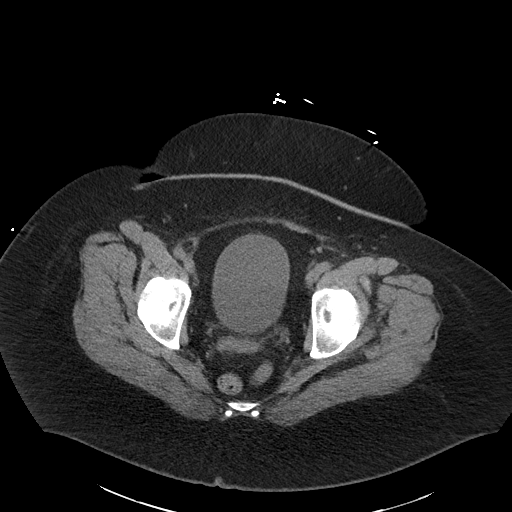
[im 32/91  soft-tissue]
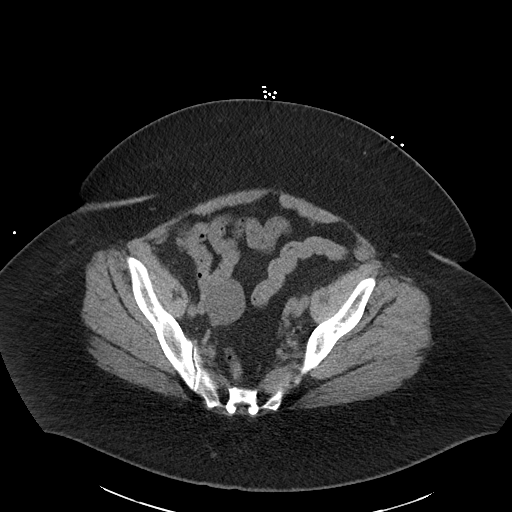
[im 39/91  soft-tissue]
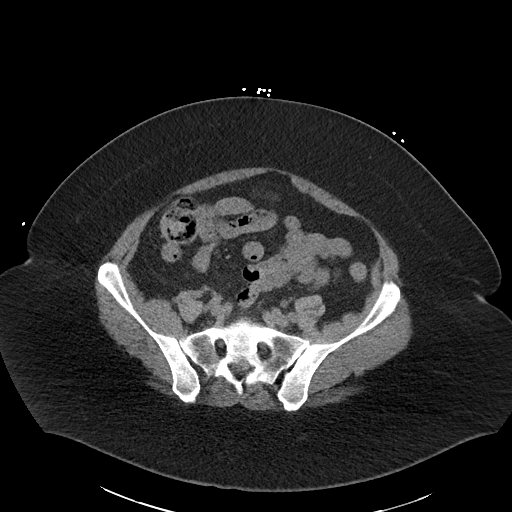
[im 46/91  soft-tissue]
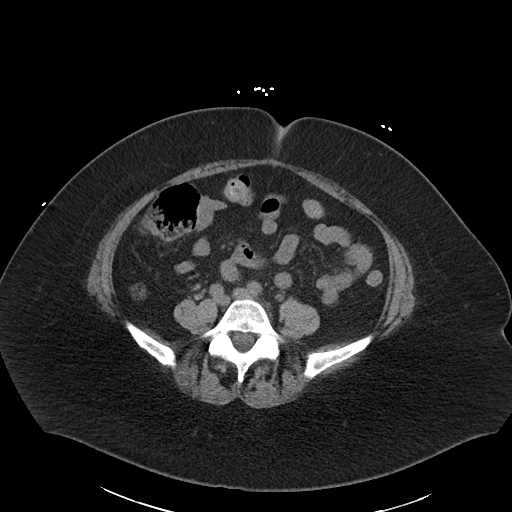
[im 52/91  soft-tissue]
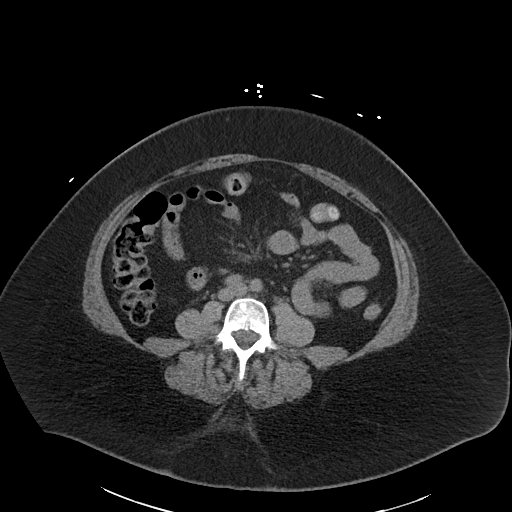
[im 59/91  soft-tissue]
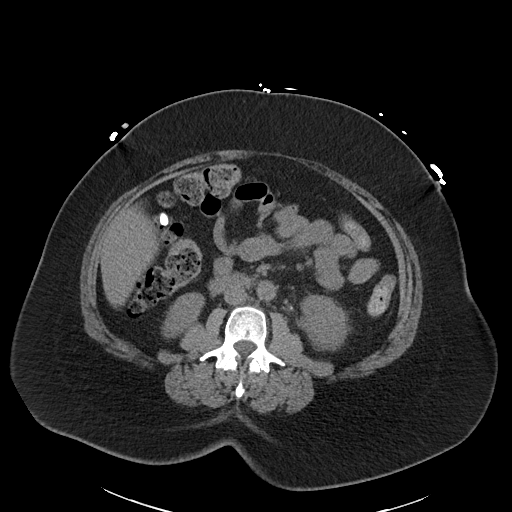
[im 59/91  bone]
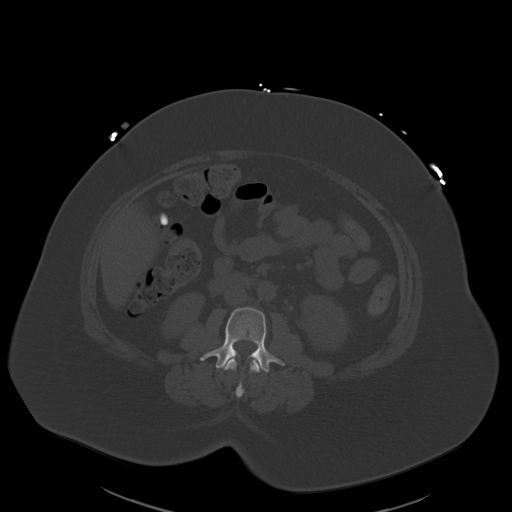
[im 66/91  soft-tissue]
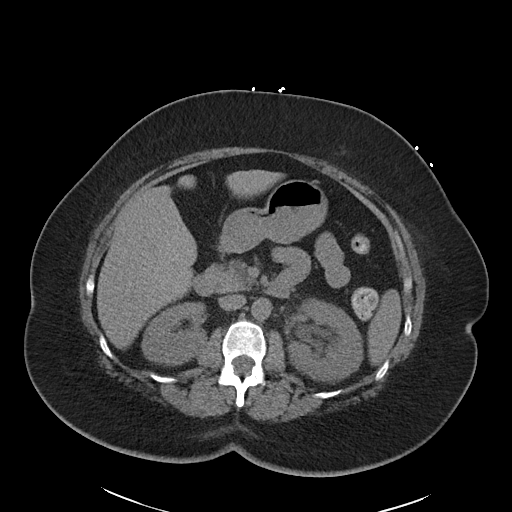
[im 73/91  soft-tissue]
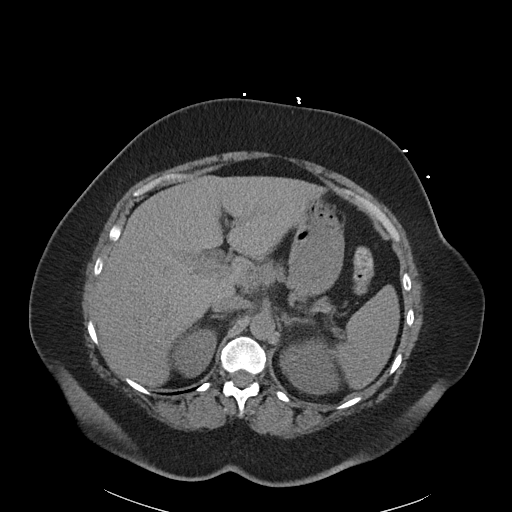
[im 80/91  soft-tissue]
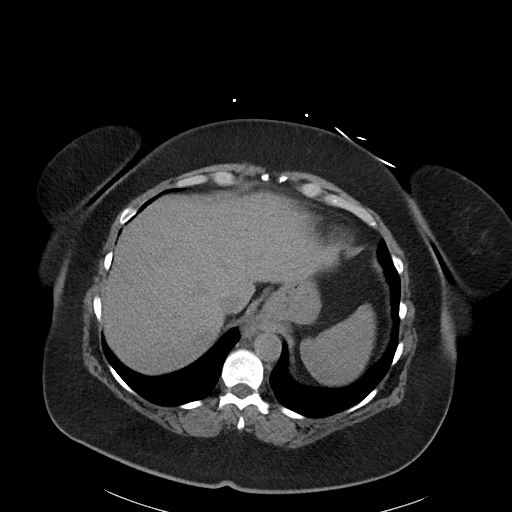
[im 87/91  soft-tissue]
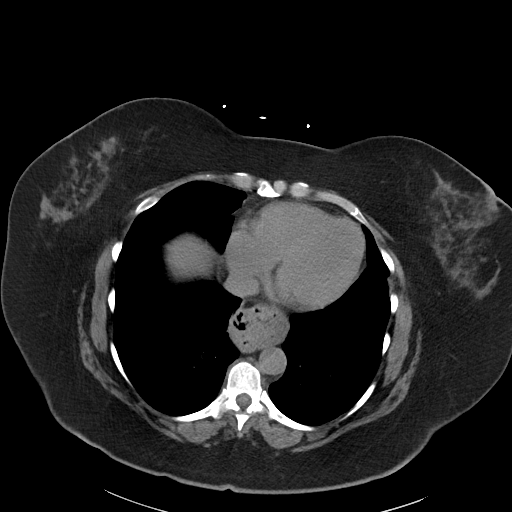

[Series 5: coronal · coronal · 0.89mm/px · 3 of 113 slices shown]
[im 38/113  soft-tissue]
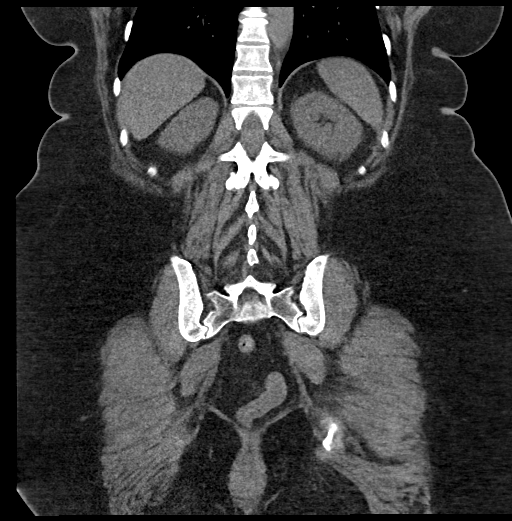
[im 50/113  soft-tissue]
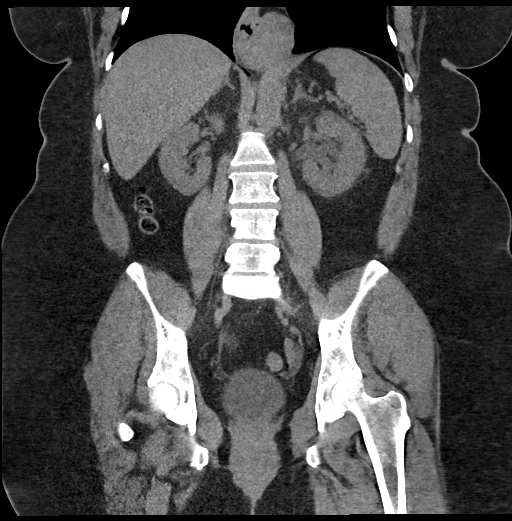
[im 63/113  soft-tissue]
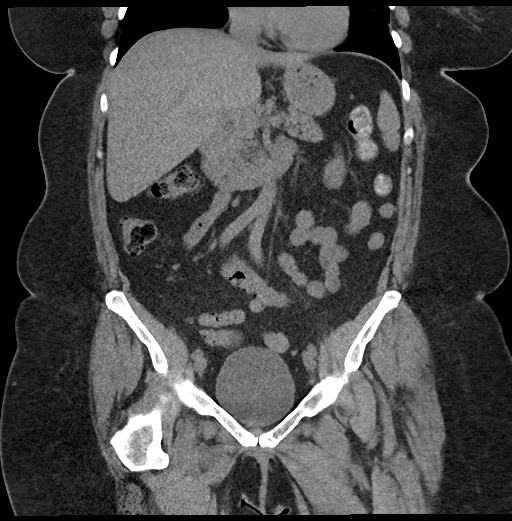

[16 of 46 positions shown; findings below may reference images not displayed]

FINDINGS: Lower chest: No acute abnormality. Moderate hiatal hernia with
intrathoracic position of the gastric fundus.

Hepatobiliary: No focal liver abnormality is seen. Status post
cholecystectomy. No biliary dilatation.

Pancreas: Unremarkable. No pancreatic ductal dilatation or
surrounding inflammatory changes.

Spleen: Normal in size without significant abnormality.

Adrenals/Urinary Tract: Adrenal glands are unremarkable. There is a
0.3 cm calculus in the most proximal left ureter, near the
ureteropelvic junction, with mild associated left hydronephrosis.
Additional nonobstructive calculus of the inferior pole of the right
kidney. No right-sided calculi, right ureteral calculi, or
hydronephrosis. Bladder is unremarkable.

Stomach/Bowel: Stomach is within normal limits. Appendix appears
normal. No evidence of bowel wall thickening, distention, or
inflammatory changes.

Vascular/Lymphatic: No significant vascular findings are present. No
enlarged abdominal or pelvic lymph nodes.

Reproductive: Status post hysterectomy. Fluid attenuation cyst of
the right ovary measuring 4.0 cm.

Other: No abdominal wall hernia or abnormality. No ascites.

Musculoskeletal: No acute or significant osseous findings.
IMPRESSION: 1. There is a 0.3 cm calculus in the most proximal left ureter, near
the ureteropelvic junction, with mild associated left
hydronephrosis.
2. Additional nonobstructive calculus of the inferior pole of the
right kidney. No right-sided calculi or right-sided hydronephrosis.
3. Moderate hiatal hernia.
4. Fluid attenuation cyst of the right ovary measuring 4.0 cm.
Although statistically likely to be benign, recommend follow-up US
in 6-12 months. Note: This recommendation does not apply to
premenarchal patients and to those with increased risk (genetic,
family history, elevated tumor markers or other high-risk factors)
of ovarian cancer. Reference: JACR [DATE]):[id number]

## 2023-10-14 DIAGNOSIS — R0789 Other chest pain: Secondary | ICD-10-CM | POA: Insufficient documentation

## 2023-10-14 DIAGNOSIS — R202 Paresthesia of skin: Secondary | ICD-10-CM | POA: Insufficient documentation

## 2023-10-14 NOTE — ED Triage Notes (Signed)
 Left arm numbness on and off all day. Left chest pressure starting later in the night. Light-headed.

## 2023-10-15 ENCOUNTER — Other Ambulatory Visit: Payer: Self-pay

## 2023-10-15 ENCOUNTER — Emergency Department (HOSPITAL_BASED_OUTPATIENT_CLINIC_OR_DEPARTMENT_OTHER)

## 2023-10-15 ENCOUNTER — Encounter (HOSPITAL_BASED_OUTPATIENT_CLINIC_OR_DEPARTMENT_OTHER): Payer: Self-pay | Admitting: Emergency Medicine

## 2023-10-15 ENCOUNTER — Emergency Department (HOSPITAL_BASED_OUTPATIENT_CLINIC_OR_DEPARTMENT_OTHER)
Admission: EM | Admit: 2023-10-15 | Discharge: 2023-10-15 | Disposition: A | Discharge status: Home / Self Care | Attending: Emergency Medicine | Admitting: Emergency Medicine

## 2023-10-15 DIAGNOSIS — R0789 Other chest pain: Secondary | ICD-10-CM

## 2023-10-15 DIAGNOSIS — R202 Paresthesia of skin: Secondary | ICD-10-CM

## 2023-10-15 LAB — TROPONIN I (HIGH SENSITIVITY): Troponin I (High Sensitivity): 4 ng/L (ref <18)

## 2023-10-15 LAB — BASIC METABOLIC PANEL
Anion gap: 7 (ref 5–15)
BUN: 12 mg/dL (ref 6–20)
CO2: 29 mmol/L (ref 22–32)
Calcium: 9.4 mg/dL (ref 8.9–10.3)
Chloride: 102 mmol/L (ref 98–111)
Creatinine, Ser: 0.65 mg/dL (ref 0.44–1.00)
GFR, Estimated: >60 mL/min (ref >60)
Glucose, Bld: 107 mg/dL — ABNORMAL HIGH (ref 70–99)
Potassium: 3.8 mmol/L (ref 3.5–5.1)
Sodium: 138 mmol/L (ref 135–145)

## 2023-10-15 LAB — CBC
HCT: 42.6 % (ref 36.0–46.0)
Hemoglobin: 14.3 g/dL (ref 12.0–15.0)
MCH: 30 pg (ref 26.0–34.0)
MCHC: 33.6 g/dL (ref 30.0–36.0)
MCV: 89.5 fL (ref 80.0–100.0)
Platelets: 378 10*3/uL (ref 150–400)
RBC: 4.76 MIL/uL (ref 3.87–5.11)
RDW: 13.4 % (ref 11.5–15.5)
WBC: 11.2 10*3/uL — ABNORMAL HIGH (ref 4.0–10.5)
nRBC: 0 % (ref 0.0–0.2)

## 2023-10-15 LAB — RESP PANEL BY RT-PCR (RSV, FLU A&B, COVID)  RVPGX2
Influenza A by PCR: NEGATIVE
Influenza B by PCR: NEGATIVE
Resp Syncytial Virus by PCR: NEGATIVE
SARS Coronavirus 2 by RT PCR: NEGATIVE

## 2023-10-15 LAB — D-DIMER, QUANTITATIVE: D-Dimer, Quant: 0.46 ug{FEU}/mL (ref 0.00–0.50)

## 2023-10-15 NOTE — ED Provider Notes (Signed)
 [place] EMERGENCY DEPARTMENT AT [facility] Provider Note   CSN: [id number] Arrival date & time: 10/14/23  2221     History  Chief Complaint  Patient presents with   Chest Pain    Charlene Dunn is a 63 y.o. female.   Chest Pain Associated symptoms: no numbness and no weakness      63 year old female with medical history significant for GERD presenting to the emergency department with paresthesias.  The patient states that she was last normal when she went to bed at 11 PM 2 nights ago.  She woke up yesterday and had intermittent paresthesias throughout the left arm.  She felt slightly anxious and was breathing fast during this time and was having left-sided chest pressure last night.  She described it more as a tightness.  No ripping or tearing sensation, no cough, fever or chills.  No squeezing sensation.  No nausea or diaphoresis.  The patient initially stated that she was having numbness and potential weakness in the left arm but subsequently recanted and stated that she was only experiencing paresthesias.  She denies any history of radiculopathy.  She denies any facial droop, numbness or weakness.  She states that symptoms have resolved.  She thinks that she may have slept on her arm wrong the night before.  Home Medications Prior to Admission medications   Medication Sig Start Date End Date Taking? Authorizing Provider  Aspirin-Salicylamide-Caffeine [phone] MG PACK Take 1 packet by mouth every 8 (eight) hours as needed (for pain or severe headache).    [provider]  calcium carbonate (OS-CAL) 600 MG TABS tablet Take 1,200 mg by mouth daily.     [provider]  calcium carbonate (TUMS - DOSED IN MG ELEMENTAL CALCIUM) 500 MG chewable tablet Chew 1 tablet by mouth at bedtime as needed for indigestion or heartburn.    [provider]  Cyanocobalamin (B-12 PO) Take 1 tablet by mouth daily.    [provider]  docusate sodium  (COLACE) 100 MG capsule Take 1 capsule (100 mg total) by mouth daily as needed for up to 30 doses. 01/16/22   Jannifer Hick, MD  EPINEPHrine 0.3 mg/0.3 mL IJ SOAJ injection Inject 0.3 mg into the muscle as needed for anaphylaxis. 11/01/21   [provider]  estradiol (ESTRACE) 2 MG tablet Take 2 mg by mouth daily.    [provider]  ibuprofen (ADVIL,MOTRIN) 200 MG tablet Take 400-600 mg by mouth every 6 (six) hours as needed for moderate pain.    [provider]  mirabegron ER (MYRBETRIQ) 50 MG TB24 tablet Take 1 tablet (50 mg total) by mouth daily. One po qd 08/10/23   Selmer Dominion, NP  Multiple Vitamin (MULTIVITAMIN WITH MINERALS) TABS tablet Take 1 tablet by mouth daily.    [provider]  nystatin-triamcinolone ointment (MYCOLOG) Apply 1 application topically 2 (two) times daily as needed. For yeast reactions (stomach/thigh areas) 07/04/16   [provider]  Omeprazole 20 MG TBEC Take 20 mg by mouth daily. 08/29/16   [provider]      Allergies    Bee venom, Cinnamon, Codeine, Sulfa antibiotics, and Yellow jacket venom    Review of Systems   Review of Systems  Respiratory:  Positive for chest tightness.   Neurological:  Negative for weakness and numbness.  All other systems reviewed and are negative.   Physical Exam Updated Vital Signs BP 133/69   Pulse 83   Temp 98 F (  36.7 C)   Resp 15   SpO2 98%  Physical Exam Vitals and nursing note reviewed.  Constitutional:      General: She is not in acute distress.    Appearance: She is well-developed.  HENT:     Head: Normocephalic and atraumatic.  Eyes:     Conjunctiva/sclera: Conjunctivae normal.  Cardiovascular:     Rate and Rhythm: Normal rate and regular rhythm.     Heart sounds: No murmur heard. Pulmonary:     Effort: Pulmonary effort is normal. No respiratory distress.     Breath sounds: Normal breath sounds.  Abdominal:     Palpations: Abdomen is soft.      Tenderness: There is no abdominal tenderness.  Musculoskeletal:        General: No swelling.     Cervical back: Neck supple.  Skin:    General: Skin is warm and dry.     Capillary Refill: Capillary refill takes less than 2 seconds.  Neurological:     Mental Status: She is alert.     Comments: MENTAL STATUS EXAM:    Orientation: Alert and oriented to person, place and time.  Memory: Cooperative, follows commands well.  Language: Speech is clear and language is normal.   CRANIAL NERVES:    CN 2 (Optic): Visual fields intact to confrontation.  CN 3,4,6 (EOM): Pupils equal and reactive to light. Full extraocular eye movement without nystagmus.  CN 5 (Trigeminal): Facial sensation is normal, no weakness of masticatory muscles.  CN 7 (Facial): No facial weakness or asymmetry.  CN 8 (Auditory): Auditory acuity grossly normal.  CN 9,10 (Glossophar): The uvula is midline, the palate elevates symmetrically.  CN 11 (spinal access): Normal sternocleidomastoid and trapezius strength.  CN 12 (Hypoglossal): The tongue is midline. No atrophy or fasciculations.Marland Kitchen   MOTOR:  Muscle Strength: 5/5RUE, 5/5LUE, 5/5RLE, 5/5LLE.   COORDINATION:   No tremor.   SENSATION:   Intact to light touch all four extremities.    Psychiatric:        Mood and Affect: Mood normal.     ED Results / Procedures / Treatments   Labs (all labs ordered are listed, but only abnormal results are displayed) Labs Reviewed  BASIC METABOLIC PANEL - Abnormal; Notable for the following components:      Result Value   Glucose, Bld 107 (*)    All other components within normal limits  CBC - Abnormal; Notable for the following components:   WBC 11.2 (*)    All other components within normal limits  RESP PANEL BY RT-PCR (RSV, FLU A&B, COVID)  [id number]  D-DIMER, QUANTITATIVE  TROPONIN I (HIGH SENSITIVITY)    EKG EKG Interpretation Date/Time:  Sunday October 14 2023 22:51:38 EDT Ventricular Rate:  90 PR Interval:  156 QRS  Duration:  88 QT Interval:  368 QTC Calculation: 450 R Axis:   56  Text Interpretation: Normal sinus rhythm Cannot rule out Anterior infarct (cited on or before 19-Sep-2016) Abnormal ECG When compared with ECG of 19-Sep-2016 08:50, No significant change was found Confirmed by Ernie Avena ([id number]) on 10/15/2023 1:55:27 AM  Radiology DG Chest Port 1 View Result Date: 10/15/2023 CLINICAL DATA:  Chest pain EXAM: PORTABLE CHEST 1 VIEW COMPARISON:  None Available. FINDINGS: The heart size and mediastinal contours are within normal limits. Both lungs are clear. The visualized skeletal structures are unremarkable. IMPRESSION: No active disease. Electronically Signed   By: Darliss Cheney M.D.   On: 10/15/2023 02:19    Procedures  Procedures    Medications Ordered in ED Medications - No data to display  ED Course/ Medical Decision Making/ A&P                                 Medical Decision Making Amount and/or Complexity of Data Reviewed Labs: ordered. Radiology: ordered.    63 year old female with medical history significant for GERD presenting to the emergency department with paresthesias.  The patient states that she was last normal when she went to bed at 11 PM 2 nights ago.  She woke up yesterday and had intermittent paresthesias throughout the left arm.  She felt slightly anxious and was breathing fast during this time and was having left-sided chest pressure last night.  She described it more as a tightness.  No ripping or tearing sensation, no cough, fever or chills.  No squeezing sensation.  No nausea or diaphoresis.  The patient initially stated that she was having numbness and potential weakness in the left arm but subsequently recanted and stated that she was only experiencing paresthesias.  She denies any history of radiculopathy.  She denies any facial droop, numbness or weakness.  She states that symptoms have resolved.  She thinks that she may have slept on her arm wrong the night  before.  On arrival, the patient was afebrile, initially tachycardic and tachypneic but subsequently improved to normal sinus rhythm with a normal respiratory rate.  Initially hypertensive but improved to BP 133/69 without intervention, saturating well on room air.  Physical exam significant for normal neurologic exam, lungs CTAB, intact and symmetric pulses.  Patient presenting with paresthesias and chest tightness.  No clear chest pain, no shortness of breath, cough fever or chills.  Low concern for ACS.  Low concern for aortic dissection, symptoms not ripping or tearing, patient has intact symmetric pulses, chest tightness not migratory and has since resolved.  Considered anxiety.  Regarding patient's paresthesias, she denies any history of radicular symptoms.  She has intact neurologically.  She adamantly denies any numbness or weakness in the left arm.  Lower concern for CVA or TIA at this time.  Considered further evaluation for potential CVA or TIA but given the patient's presentation do not think this is indicated.  Regarding the patient's chest tightness, a workup was initiated to include an EKG which revealed normal sinus rhythm, ventricular rate 9 0, no acute ischemic changes.  A chest x-ray revealed no active disease.  Laboratory evaluation revealed cardiac troponin 4, D-dimer normal, BMP unremarkable with no electrolyte abnormality, COVID and influenza and RSV PCR testing negative, CBC without anemia.  Patient overall well-appearing, low concern for ACS, do not think repeat cardiac troponin testing is indicated at this time.  Regarding the patient's paresthesias, patient is currently asymptomatic and neuro intact, recommend that she follow-up outpatient with her PCP, return precautions provided in the event of any strokelike symptoms.    Final Clinical Impression(s) / ED Diagnoses Final diagnoses:  Paresthesias  Feeling of chest tightness    Rx / DC Orders ED Discharge Orders     None          Ernie Avena, MD 10/15/23 [phone]

## 2023-10-15 NOTE — ED Notes (Signed)
 ED Provider at bedside.

## 2023-10-15 NOTE — Discharge Instructions (Addendum)
 Your emergency department workup was overall reassuring and your neurologic exam is normal.  Return for any strokelike symptoms which include difficulty talking, speaking or swallowing, confusion, focal numbness or weakness in any extremity or facial droop or numbness.

## 2024-02-14 ENCOUNTER — Ambulatory Visit: Admitting: Obstetrics and Gynecology

## 2024-02-14 ENCOUNTER — Encounter: Payer: Self-pay | Admitting: Obstetrics and Gynecology

## 2024-02-14 VITALS — BP 138/87 | HR 89

## 2024-02-14 DIAGNOSIS — N952 Postmenopausal atrophic vaginitis: Secondary | ICD-10-CM | POA: Diagnosis not present

## 2024-02-14 DIAGNOSIS — R350 Frequency of micturition: Secondary | ICD-10-CM | POA: Diagnosis not present

## 2024-02-14 DIAGNOSIS — N3281 Overactive bladder: Secondary | ICD-10-CM | POA: Diagnosis not present

## 2024-02-14 MED ORDER — ESTRADIOL 0.1 MG/GM VA CREA: 0.5000 g | TOPICAL_CREAM | VAGINAL | 11 refills | Status: AC

## 2024-02-14 MED ORDER — MIRABEGRON ER 50 MG PO TB24: 50.0000 mg | ORAL_TABLET | Freq: Every day | ORAL | 3 refills | Status: AC

## 2024-02-14 NOTE — Progress Notes (Signed)
  Urogynecology Return Visit  SUBJECTIVE  History of Present Illness: Charlene Dunn is a 63 y.o. female seen in follow-up for OAB. Plan at last visit was increase Myrbetriq  to 50mg  daily.   Patient reports she sleeps through the night most nights and gets up at 5:30 sometimes. Overall is happy and wants to continue Myrbetriq .   Denies other issues except some vaginal dryness.   Past Medical History: Patient  has a past medical history of Cholecystitis, GERD (gastroesophageal reflux disease), Headache, History of kidney stones, and PONV (postoperative nausea and vomiting).   Past Surgical History: She  has a past surgical history that includes Vaginal hysterectomy (1995); Tubal ligation (1994); Tonsillectomy; Colonoscopy (2023); Cholecystectomy (N/A, 09/25/2016); Breast biopsy (Right, 12/2014); Cystoscopy/ureteroscopy/holmium laser/stent placement (Left, 01/16/2022); and scar tissue removal.   Medications: She has a current medication list which includes the following prescription(s): aspirin-salicylamide-caffeine, calcium carbonate, calcium carbonate, cyanocobalamin, docusate sodium , epinephrine, estradiol , ibuprofen, multivitamin with minerals, nystatin-triamcinolone ointment, omeprazole, [START ON 02/18/2024] estradiol , and mirabegron  er.   Allergies: Patient is allergic to bee venom, cinnamon, codeine, sulfa antibiotics, and yellow jacket venom.   Social History: Patient  reports that she has never smoked. She has never used smokeless tobacco. She reports current alcohol use. She reports that she does not use drugs.     OBJECTIVE     Physical Exam: Vitals:   02/14/24 0929  BP: 138/87  Pulse: 89   Gen: No apparent distress, A&O x 3.  Detailed Urogynecologic Evaluation:  Deferred.    ASSESSMENT AND PLAN    Ms. Mazer is a 63 y.o. with:  1. Vaginal atrophy   2. Overactive bladder   3. Urinary frequency    Patient has vaginal atrophy symptoms. She would benefit  from estrogen cream. Patient to use a blueberry sized amount into the vagina. She may use this nightly for 2 weeks and then twice weekly after. We discussed using silicon based/blend lubricant for intercourse or something soothing such as an aloe based lubricant or something with hyaluronic acid.  Patient to continue on Myrbetriq  50mg  daily.  Patient to call if medication is not working or if she has concerns of UTI.   Patient to follow up in 1 year or sooner if needed.   Cheral Cappucci G Keeleigh Terris, NP

## 2024-04-15 ENCOUNTER — Other Ambulatory Visit: Payer: Self-pay | Admitting: Obstetrics & Gynecology

## 2024-04-15 DIAGNOSIS — Z1231 Encounter for screening mammogram for malignant neoplasm of breast: Secondary | ICD-10-CM

## 2024-06-04 ENCOUNTER — Ambulatory Visit
Admission: RE | Admit: 2024-06-04 | Discharge: 2024-06-04 | Disposition: A | Source: Ambulatory Visit | Attending: Obstetrics & Gynecology | Admitting: Obstetrics & Gynecology

## 2024-06-04 DIAGNOSIS — Z1231 Encounter for screening mammogram for malignant neoplasm of breast: Secondary | ICD-10-CM

## 2024-08-18 ENCOUNTER — Encounter: Payer: Self-pay | Admitting: *Deleted
